# Patient Record
Sex: Male | Born: 1975 | Race: White | Hispanic: No | Marital: Married | Smoking: Never smoker
Health system: Southern US, Community
[De-identification: ages and names within clinical notes are randomized; demographics above are authoritative.]

## PROBLEM LIST (undated history)

## (undated) HISTORY — PX: FOREARM SURGERY: SHX651

## (undated) HISTORY — PX: ORIF FOREARM FRACTURE: SHX2124

---

## 2005-08-29 ENCOUNTER — Emergency Department (HOSPITAL_COMMUNITY): Admission: EM | Admit: 2005-08-29 | Discharge: 2005-08-29 | Payer: Self-pay | Admitting: Emergency Medicine

## 2005-09-03 ENCOUNTER — Ambulatory Visit (HOSPITAL_COMMUNITY): Admission: RE | Admit: 2005-09-03 | Discharge: 2005-09-03 | Payer: Self-pay | Admitting: Orthopaedic Surgery

## 2011-04-30 ENCOUNTER — Emergency Department (HOSPITAL_COMMUNITY): Payer: Self-pay

## 2011-04-30 ENCOUNTER — Emergency Department (HOSPITAL_COMMUNITY)
Admission: EM | Admit: 2011-04-30 | Discharge: 2011-04-30 | Disposition: A | Discharge status: Home / Self Care | Payer: Self-pay | Attending: Emergency Medicine | Admitting: Emergency Medicine

## 2011-04-30 DIAGNOSIS — R55 Syncope and collapse: Secondary | ICD-10-CM | POA: Insufficient documentation

## 2011-04-30 DIAGNOSIS — R42 Dizziness and giddiness: Secondary | ICD-10-CM | POA: Insufficient documentation

## 2011-04-30 DIAGNOSIS — R569 Unspecified convulsions: Secondary | ICD-10-CM | POA: Insufficient documentation

## 2011-04-30 DIAGNOSIS — R1110 Vomiting, unspecified: Secondary | ICD-10-CM | POA: Insufficient documentation

## 2011-04-30 LAB — DIFFERENTIAL
Basophils Relative: 0 % (ref 0–1)
Lymphocytes Relative: 6 % — ABNORMAL LOW (ref 12–46)
Monocytes Absolute: 0.8 10*3/uL (ref 0.1–1.0)
Monocytes Relative: 5 % (ref 3–12)

## 2011-04-30 LAB — COMPREHENSIVE METABOLIC PANEL
AST: 20 U/L (ref 0–37)
Albumin: 4.5 g/dL (ref 3.5–5.2)
Alkaline Phosphatase: 62 U/L (ref 39–117)
Chloride: 99 mEq/L (ref 96–112)
GFR calc Af Amer: >90 mL/min (ref >90)
Potassium: 3.5 mEq/L (ref 3.5–5.1)
Total Bilirubin: 0.5 mg/dL (ref 0.3–1.2)

## 2011-04-30 LAB — CBC
HCT: 43.3 % (ref 39.0–52.0)
Hemoglobin: 14.4 g/dL (ref 13.0–17.0)
MCHC: 33.3 g/dL (ref 30.0–36.0)
MCV: 87.1 fL (ref 78.0–100.0)
RBC: 4.97 MIL/uL (ref 4.22–5.81)
WBC: 16.6 10*3/uL — ABNORMAL HIGH (ref 4.0–10.5)

## 2014-06-04 ENCOUNTER — Emergency Department: Payer: Self-pay | Admitting: Emergency Medicine

## 2014-10-30 ENCOUNTER — Emergency Department (HOSPITAL_COMMUNITY): Payer: Medicaid Other

## 2014-10-30 ENCOUNTER — Encounter (HOSPITAL_COMMUNITY): Payer: Self-pay | Admitting: Emergency Medicine

## 2014-10-30 ENCOUNTER — Emergency Department (HOSPITAL_COMMUNITY)
Admission: EM | Admit: 2014-10-30 | Discharge: 2014-10-30 | Disposition: A | Discharge status: Home / Self Care | Payer: Medicaid Other | Attending: Emergency Medicine | Admitting: Emergency Medicine

## 2014-10-30 DIAGNOSIS — Y998 Other external cause status: Secondary | ICD-10-CM | POA: Insufficient documentation

## 2014-10-30 DIAGNOSIS — M549 Dorsalgia, unspecified: Secondary | ICD-10-CM

## 2014-10-30 DIAGNOSIS — S3992XA Unspecified injury of lower back, initial encounter: Secondary | ICD-10-CM | POA: Insufficient documentation

## 2014-10-30 DIAGNOSIS — Y9389 Activity, other specified: Secondary | ICD-10-CM | POA: Diagnosis not present

## 2014-10-30 DIAGNOSIS — Z79899 Other long term (current) drug therapy: Secondary | ICD-10-CM | POA: Diagnosis not present

## 2014-10-30 DIAGNOSIS — R202 Paresthesia of skin: Secondary | ICD-10-CM

## 2014-10-30 DIAGNOSIS — R110 Nausea: Secondary | ICD-10-CM | POA: Diagnosis not present

## 2014-10-30 DIAGNOSIS — M5116 Intervertebral disc disorders with radiculopathy, lumbar region: Secondary | ICD-10-CM

## 2014-10-30 DIAGNOSIS — X58XXXA Exposure to other specified factors, initial encounter: Secondary | ICD-10-CM | POA: Insufficient documentation

## 2014-10-30 DIAGNOSIS — Y929 Unspecified place or not applicable: Secondary | ICD-10-CM | POA: Insufficient documentation

## 2014-10-30 MED ORDER — OXYCODONE HCL 5 MG PO TABS
15.0000 mg | ORAL_TABLET | Freq: Once | ORAL | Status: AC
Start: 2014-10-30 — End: 2014-10-30
  Administered 2014-10-30: 15 mg via ORAL
  Filled 2014-10-30: qty 3

## 2014-10-30 MED ORDER — CYCLOBENZAPRINE HCL 10 MG PO TABS: 10.0000 mg | ORAL_TABLET | Freq: Three times a day (TID) | ORAL | Status: AC | PRN

## 2014-10-30 MED ORDER — ONDANSETRON 4 MG PO TBDP
8.0000 mg | ORAL_TABLET | Freq: Once | ORAL | Status: AC
  Administered 2014-10-30: 8 mg via ORAL
  Filled 2014-10-30: qty 2

## 2014-10-30 MED ORDER — IBUPROFEN 400 MG PO TABS
800.0000 mg | ORAL_TABLET | Freq: Once | ORAL | Status: AC
  Administered 2014-10-30: 800 mg via ORAL
  Filled 2014-10-30: qty 2

## 2014-10-30 MED ORDER — ONDANSETRON 4 MG PO TBDP: ORAL_TABLET | ORAL | Status: AC

## 2014-10-30 MED ORDER — PREDNISONE 20 MG PO TABS
60.0000 mg | ORAL_TABLET | Freq: Once | ORAL | Status: AC
  Administered 2014-10-30: 60 mg via ORAL
  Filled 2014-10-30: qty 3

## 2014-10-30 MED ORDER — OXYCODONE-ACETAMINOPHEN 10-325 MG PO TABS: 1.0000 | ORAL_TABLET | ORAL | Status: DC | PRN

## 2014-10-30 MED ORDER — HYDROMORPHONE HCL 1 MG/ML IJ SOLN
2.0000 mg | Freq: Once | INTRAMUSCULAR | Status: AC
  Administered 2014-10-30: 2 mg via INTRAMUSCULAR
  Filled 2014-10-30: qty 2

## 2014-10-30 MED ORDER — DIAZEPAM 5 MG/ML IJ SOLN
5.0000 mg | Freq: Once | INTRAMUSCULAR | Status: AC
  Administered 2014-10-30: 5 mg via INTRAMUSCULAR
  Filled 2014-10-30: qty 2

## 2014-10-30 MED ORDER — PREDNISONE 20 MG PO TABS: ORAL_TABLET | ORAL | Status: AC

## 2014-10-30 MED ORDER — IBUPROFEN 800 MG PO TABS: 800.0000 mg | ORAL_TABLET | Freq: Three times a day (TID) | ORAL | Status: AC

## 2014-10-30 NOTE — ED Notes (Signed)
Pt sts yesterday that he bent over to pick something up and felt a pop in lower back. C.o pain radiating down both legs. Denies weakness/numbness/tingling. Pt ambulatory.

## 2014-10-30 NOTE — Discharge Instructions (Signed)
1. Medications: Oxycodone, Flexeril, ibuprofen, prednisone, Zofran, usual home medications 2. Treatment: rest, drink plenty of fluids, gentle stretching as discussed, alternate ice and heat 3. Follow Up: Please followup with your primary doctor in 3 days for discussion of your diagnoses and further evaluation after today's visit; if you do not have a primary care doctor use the resource guide provided to find one;  Return to the ER for worsening back pain, difficulty walking, loss of bowel or bladder control or other concerning symptoms    Herniated Disk A herniated disk occurs when a disk in your spine bulges out too far. This condition is also called a ruptured disk or slipped disk. Your spine (backbone) is made up of bones called vertebrae. Between each pair of vertebrae is an oval disk with a soft, spongy center that acts as a shock absorber when you move. The spongy center is surrounded by a tough outer ring. When you have a herniated disk, the spongy center of the disk bulges out or ruptures through the outer ring. A herniated disk can press on a nerve between your vertebrae and cause pain. A herniated disk can occur anywhere in your back or neck area, but the lower back is the most common spot. CAUSES  In many cases, a herniated disk occurs just from getting older. As you age, the spongy insides of your disks tend to shrink and dry out. A herniated disk can result from gradual wear and tear. Injury or sudden strain can also cause a herniated disk.  RISK FACTORS Aging is the main risk factor for a herniated disk. Other risk factors include:  Being a man between the ages of 83 and 37 years.  Having a job that requires heavy lifting, bending, or twisting.  Having a job that requires long hours of driving.  Not getting enough exercise.  Being overweight.  Smoking. SIGNS AND SYMPTOMS  Signs and symptoms depend on which disk is herniated.  For a herniated disk in the lower back, you may  have sharp pain in:  One part of your leg, hip, or buttocks.  The back of your calf.  The top or sole of your foot (sciatica).   For a herniated disk in the neck, you may feel pain:  When you move your neck.  Near or over your shoulder blade.  That moves to your upper arm, forearm, or fingers.   You may also have muscle weakness. It may be hard to:  Lift your leg or arm.  Stand on your toes.  Squeeze tightly with one of your hands.  Other symptoms can include:  Numbness or tingling in the affected areas of your body.  Loss of bladder or bowel control. This is a rare but serious sign of a severe herniated disk in the lower back. DIAGNOSIS  Your health care provider will do a physical exam. During this exam, you may have to move certain body parts or assume various positions. For example, your health care provider may do the straight-leg test. This is a good way to test for a herniated disk in your lower back. In this test, the health care provider lifts your leg while you lie on your back. This is to see if you feel pain down your leg. Your health care provider will also check for numbness or loss of feeling.  Your health care provider will also check your:  Reflexes.  Muscle strength.  Posture.  Other tests may be done to help in making  a diagnosis. These may include:  An X-ray of the spine to rule out other causes of back pain.   Other imaging studies, such as an MRI or CT scan. This is to check whether the herniated disk is pressing on your spinal canal.  Electromyography (EMG). This test checks the nerves that control muscles. It is sometimes used to identify the specific area of nerve involvement.  TREATMENT  In many cases, herniated disk symptoms go away over a period of days or weeks. You will most likely be free of symptoms in 3-4 months. Treatment may include the following:  The initial treatment for a herniated disk is ashort period of rest.  Bed rest  is often limited to 1 or 2 days. Resting for too long delays recovery.  If you have a herniated disk in your lower back, you should avoid sitting as much as possible because sitting increases pressure on the disk.  Medicines. These may include:   Nonsteroidal anti-inflammatory drugs (NSAIDs).  Muscle relaxants for back spasms.  Narcotic pain medicine if your pain is very bad.   Steroid injections. You may need these along the involved nerve root to help control pain. The steroid is injected in the area of the herniated disk. It helps by reducing swelling around the disk.  Physical therapy. This may include exercises to strengthen the muscles that help support your spine.   You may need surgery if other treatments do not work.  HOME CARE INSTRUCTIONS Follow all your health care provider's instructions. These may include:  Take all medicines as directed by your health care provider.  Rest for 2 days and then start moving.  Do not sit or stand for long periods of time.  Maintain good posture when sitting and standing.  Avoid movements that cause pain, such as bending or lifting.  When you are able to start lifting things again:  [place] with your knees.  Keep your back straight.  Hold heavy objects close to your body.  If you are overweight, ask your health care provider to help you start a weight-loss program.  When you are able to start exercising, ask your health care provider how much and what type of exercise is best for you.  Work with a physical therapist on stretching and strengthening exercises for your back.  Do not wear high-heeled shoes.  Do not sleep on your belly.  Do not smoke.  Keep all follow-up visits as directed by your health care provider. SEEK MEDICAL CARE IF:  You have back or neck pain that is not getting better after 4 weeks.  You have very bad pain in your back or neck.  You develop numbness, tingling, or weakness along with pain. SEEK  IMMEDIATE MEDICAL CARE IF:   You have numbness, tingling, or weakness that makes you unable to use your arms or legs.  You lose control of your bladder or bowels.  You have dizziness or fainting.  You have shortness of breath.  MAKE SURE YOU:   Understand these instructions.  Will watch your condition.  Will get help right away if you are not doing well or get worse. Document Released: 06/25/2000 Document Revised: 11/12/2013 Document Reviewed: 06/01/2013 [facility] Patient Information 2015 [place], [place]. This information is not intended to replace advice given to you by your health care provider. Make sure you discuss any questions you have with your health care provider.    Emergency Department Resource Guide 1) Find a Doctor and Pay Out of Pocket  Although you won't have to find out who is covered by your insurance plan, it is a good idea to ask around and get recommendations. You will then need to call the office and see if the doctor you have chosen will accept you as a new patient and what types of options they offer for patients who are self-pay. Some doctors offer discounts or will set up payment plans for their patients who do not have insurance, but you will need to ask so you aren't surprised when you get to your appointment.  2) Contact Your Local Health Department Not all health departments have doctors that can see patients for sick visits, but many do, so it is worth a call to see if yours does. If you don't know where your local health department is, you can check in your phone book. The CDC also has a tool to help you locate your state's health department, and many state websites also have listings of all of their local health departments.  3) Find a Walk-in Clinic If your illness is not likely to be very severe or complicated, you may want to try a walk in clinic. These are popping up all over the country in pharmacies, drugstores, and shopping centers. They're usually  staffed by nurse practitioners or physician assistants that have been trained to treat common illnesses and complaints. They're usually fairly quick and inexpensive. However, if you have serious medical issues or chronic medical problems, these are probably not your best option.  No Primary Care Doctor: - Call Health Connect at  [phone] - they can help you locate a primary care doctor that  accepts your insurance, provides certain services, etc. - Physician Referral Service- [phone]  Chronic Pain Problems: Organization         Address  Phone   Notes  [facility]  [phone] Patients need to be referred by their primary care doctor.   Medication Assistance: Organization         Address  Phone   Notes  [facility] [address] [place] [place], [place] [address] [phone] --Must be a resident of [facility] -- Must have NO insurance coverage whatsoever (no Medicaid/ Medicare, etc.) -- The pt. MUST have a primary care doctor that directs their care regularly and follows them in the community   [facility]  [phone]   [employer]  [phone]    Agencies that provide inexpensive medical care: Organization         Address  Phone   Notes  [facility]  [phone]   [facility]    [phone]   [facility] [address] [place], [place] [address] [phone]   [facility] [address] [place] [place] [phone]   Planned Parenthood    [phone]   [facility]    [phone]   [facility]  [address], [place] Phone:  [phone], Fax:  [phone] Hours of Operation:  9 am - 6 pm, M-F.  Also accepts Medicaid/Medicare and self-pay.  [facility]  [address], [place] Phone: [phone], Fax: [phone]. Hours of  Operation:  8:30 am - 5:30 pm, M-F.  Also accepts Medicaid and self-pay.  [facility] [place] [address], [employer] Phone: [phone]   [facility]  [address] [place], [place] [phone], Ext. [phone] Mondays & Thursdays: 7-9 AM.  First 15 patients are seen on a first come, first serve basis.    Medicaid-accepting [facility] Providers:  Organization         Address  Phone   Notes  [facility] [address], [place] [phone] Also accepts self-pay patients.  [facility] [address] Laurell Josephs [place], [place]  [phone]   [facility] [address], [place] [phone]   [facility] [address] [place] [phone]   Renaye Rakers [address], [place]   [phone] Only accepts [place] patients after they have their name applied to their card.   Self-Pay (no insurance) in [facility]:  Organization         Address  Phone   Notes  Sickle Cell Patients, [facility] [address] [place], [place] [phone]   [facility] [address] [place], [place] [phone]   [facility] [place]  [address], [place] [phone]   [facility]/Dr. Osei-Bonsu  [address], [place] or [address], [place] [phone] Phone number for both [place] and [place] locations is the same.  Urgent Medical and [facility] [address], [place] [phone]   [facility] [address] [place] or [address] [phone]   [facility] [address] [place] [phone], phone; [phone], fax Sees patients 1st and 3rd Saturday of every month.  Must not qualify for public or private insurance (i.e. Medicaid, Medicare,  [place] Health Choice, Veterans' Benefits)  Household income should be no more than 200% of the poverty level The clinic cannot treat you if you are pregnant or think you are pregnant  Sexually transmitted diseases are not treated at the clinic.    Dental Care: Organization         Address  Phone  Notes  [facility] Department of [facility] [address] [place], [place] [phone] Accepts children up to age 27 who are enrolled in [place] or [place] Health Choice; pregnant women with a Medicaid card; and children who have applied for Medicaid or [place] Health Choice, but were declined, whose parents can pay a reduced fee at time of service.  [facility] Department of [facility]  [address], [place] [phone] Accepts children up to age 28 who are enrolled in [place] or [place] Health Choice; pregnant women with a Medicaid card; and children who have applied for Medicaid or [place] Health Choice, but were declined, whose parents can pay a reduced fee at time of service.  [facility]  [address] [place], [place] [phone] Patients are seen by appointment only. Walk-ins are not accepted. [facility] will see patients 27 years of age and older. Monday - Tuesday (8am-5pm) Most Wednesdays (8:30-5pm) $30 per visit, cash only  [facility] Adult Dental Access [facility]  [address], [facility] [phone] Patients are seen by appointment only. Walk-ins are not accepted. [facility] will see patients 29 years of age and older. One Wednesday Evening (Monthly: Volunteer Based).  $30 per visit, cash only  [employer]  [employer] [facility] [facility]  [phone] for adults; Children under age 2, call Graduate Pediatric Dentistry at [phone]. Children aged 71-14, please call [phone] to request a pediatric application.  Dental services are provided in all areas of dental care including fillings, crowns and bridges,  complete and partial dentures, implants, gum treatment, root canals, and extractions. Preventive care is also provided. Treatment is provided to both adults and children. Patients are selected via a lottery and there is often a waiting list.   [facility] [address], [employer]  [phone] [email or web address]   [facility] [address] [place], [place] [phone], Ext. [phone] Second and Fourth Thursday of each month, opens at 6:30 AM; Clinic ends at 9 AM.  Patients are seen on a first-come first-served basis, and a limited number are seen during each clinic.   [facility]  [address] Ether [place]ins [place], [place] [phone]   Eligibility Requirements You must have lived in [place], [place], or [place] counties for at least the last three months.   You cannot be eligible for state or federal sponsored [place], including [employer], [place], or [employer].   You generally cannot be eligible for healthcare insurance through your employer.    How to apply: Eligibility screenings are held every Tuesday and Wednesday afternoon from 1:00 pm until 4:00 pm. You do not need an appointment for the interview!  [facility] [address], [place], [place] [phone]   [facility] Health Department  [phone]   [facility] Health Department  [phone]   [facility] [place] [facility]  [phone]    Behavioral Health Resources in the Community: Intensive Outpatient Programs Organization         Address  Phone  Notes  [facility] [address], [place], [place] [phone]   [facility] [address] [place], [place] [phone]   [facility] [address] [place], [place]  [phone]   [facility] [address],  [place], [place] [phone] or [phone]   Substance Abuse Resources Organization          Address  Phone  Notes  [facility] Drug Services  [phone]   [facility]  [phone]   [facility] [place]  [phone]   Floydene Flock  [phone]   Residential & Outpatient Substance Abuse Program  [phone]   Psychological Services Organization         Address  Phone  Notes  [facility]  [phone]   [facility]  [phone]   [facility] [address] [place] [phone] or [phone]    Mobile Crisis Teams Organization         Address  Phone  Notes  [facility], Mobile Crisis Care Unit  [phone]   [facility]  [address] [place], [place] [phone]   Doristine Locks [address] [place] [phone]    Self-Help/Support Groups Organization         Address  Phone             Notes  [facility] [place] - variety of support groups  [phone]- I7437963 Call for more information  Narcotics Anonymous (NA), [facility] [address], [employer] [place]  2 meetings at this location   Statistician  Address  Phone  Notes  [facility] [address],    [place]  [phone]   [facility]  [address], [place] [phone], [place], [place] [phone]   [facility] [address], [place] [phone] Admissions: 8am-3pm M-F  [facility] [address]    [place], [place] [phone]   [facility] [address], [place], [place] [phone]   [facility] [address]  [place], [place] [phone]   [facility] - Intensive Outpatient [address], [place], [place] [phone]   [facility] ([facility] [address]  [place], [place] [phone] or [phone]   [facility] ([facility]) [address] [place], [place]  [phone] Accepts Medicaid  [facility] [address]  [place] [phone] Substance Abuse/Addiction Treatment   [facility] Behavioral Health Resources Organization         Address  Phone  Notes  [facility]  [phone]) [phone]   Angie FavaJulie Brannon, PhD [address] [place], [place]   [phone]) [phone] or [phone]) [phone]   [facility]   [address] [place], [place] [phone](336) [phone]   [facility] [address], [place], [place] [phone](336) [phone] Insurance/Medicaid/sponsorship through [facility]  [facility] [address]                                    [place], [place] [phone](336) [phone] Therapy/tele-psych/case  [facility] [address].   [place], [place] [phone](336) [phone]    Dr. Lolly MustacheArfeen  [phone]   [facility]  [employer] [facility] 1) [address], [place] 2) [address], [place] 3)  [address], [place] [phone](336) [phone]  [phone]) [phone]   [facility] Child Abuse Hotline [phone](336) [phone] or [phone](336) [phone] (After Hours)

## 2014-10-30 NOTE — ED Provider Notes (Signed)
CSN: [id number]     Arrival date & time 10/30/14  1954 History  This chart is scribed for non-physician practitioner, Dierdre ForthHannah Sarea Fyfe, PA-C, working with Eber HongBrian Miller, MD by Abel PrestoKara Demonbreun, ED Scribe.  This patient was seen in room TR08C/TR08C and the patient's care was started 8:45 PM.      Chief Complaint  Patient presents with  . Back Pain    Patient is a 39 y.o. male presenting with back pain. The history is provided by the patient, medical records and a significant other. No language interpreter was used.  Back Pain Associated symptoms: leg pain, paresthesias (shooting), tingling and weakness   Associated symptoms: no abdominal pain, no bladder incontinence, no bowel incontinence, no chest pain, no dysuria, no fever, no headaches, no numbness and no perianal numbness    HPI Comments: Peter Sierrasravis L Glover is a 39 y.o. male who presents to the Emergency Department complaining of constant lower back pain with onset last night. Pt states he bent over to pick up something and heard a pop at onset. Pt states pain is worse in sacrococcyx region. He reports pain radiates down bilateral legs to knees with sharp pain. Pt notes associated subjective weakness, tingling, and nausea. Pt last took ibuprofen for relief around 12 PM. He is able to ambulate. Pt denies significant PMHx or h/o back problems. Pt with h/o of surgery in left forearm and metal rod placement. Pt denies numbness, bowel or bladder incontinence, vomiting, and saddle anesthesia.   History reviewed. No pertinent past medical history. Past Surgical History  Procedure Laterality Date  . Forearm surgery Left    No family history on file. History  Substance Use Topics  . Smoking status: Never Smoker   . Smokeless tobacco: Current User  . Alcohol Use: No    Review of Systems  Constitutional: Negative for fever and fatigue.  Respiratory: Negative for chest tightness and shortness of breath.   Cardiovascular: Negative for chest  pain.  Gastrointestinal: Positive for nausea. Negative for vomiting, abdominal pain, diarrhea and bowel incontinence.  Genitourinary: Negative for bladder incontinence, dysuria, urgency, frequency and hematuria.  Musculoskeletal: Positive for myalgias, back pain and gait problem ( 2/2 pain). Negative for joint swelling, neck pain and neck stiffness.  Skin: Negative for rash.  Neurological: Positive for tingling, weakness and paresthesias (shooting). Negative for light-headedness, numbness and headaches.  All other systems reviewed and are negative.     Allergies  Review of patient's allergies indicates no known allergies.  Home Medications   Prior to Admission medications   Medication Sig Start Date End Date Taking? Authorizing Provider  cetirizine-pseudoephedrine (ZYRTEC-D) 5-120 MG per tablet Take 1 tablet by mouth daily.   Yes Historical Provider, MD  cyclobenzaprine (FLEXERIL) 10 MG tablet Take 1 tablet (10 mg total) by mouth 3 (three) times daily as needed for muscle spasms. 10/30/14   Miah Boye, PA-C  ibuprofen (ADVIL,MOTRIN) 800 MG tablet Take 1 tablet (800 mg total) by mouth 3 (three) times daily. 10/30/14   Choua Ikner, PA-C  ondansetron (ZOFRAN ODT) 4 MG disintegrating tablet 4mg  ODT q4 hours prn nausea/vomit 10/30/14   Yarlin Breisch, PA-C  oxyCODONE-acetaminophen (PERCOCET) 10-325 MG per tablet Take 1 tablet by mouth every 4 (four) hours as needed for pain. 10/30/14   Clarissa Laird, PA-C  predniSONE (DELTASONE) 20 MG tablet 3 tabs po daily x 3 days, then 2 tabs x 3 days, then 1.5 tabs x 3 days, then 1 tab x 3 days, then 0.5 tabs x 3  days 10/30/14   Dahlia Client Magenta Schmiesing, PA-C   BP 115/75 mmHg  Pulse 74  Temp(Src) 97.4 F (36.3 C) (Oral)  Resp 16  Ht  (1.854 m)  Wt 220 lb (99.791 kg)  BMI 29.03 kg/m2  SpO2 96% Physical Exam  Constitutional: He appears well-developed and well-nourished. No distress.  HENT:  Head: Normocephalic and atraumatic.   Mouth/Throat: Oropharynx is clear and moist. No oropharyngeal exudate.  Eyes: Conjunctivae are normal.  Neck: Normal range of motion. Neck supple.  Full ROM without pain  Cardiovascular: Normal rate, regular rhythm, normal heart sounds and intact distal pulses.   Pulmonary/Chest: Effort normal and breath sounds normal. No respiratory distress. He has no wheezes.  Abdominal: Soft. He exhibits no distension. There is no tenderness.  Musculoskeletal:  Full range of motion of the T-spine and L-spine No tenderness to palpation of the spinous processes of the T-spine  Significant tenderness to palpation of the spinous processes of  L5-S1 Tenderness to palpation of the paraspinous muscles of the L-spine  Lymphadenopathy:    He has no cervical adenopathy.  Neurological: He is alert. He has normal reflexes.  Reflex Scores:      Bicep reflexes are 2+ on the right side and 2+ on the left side.      Brachioradialis reflexes are 2+ on the right side and 2+ on the left side.      Patellar reflexes are 2+ on the right side and 2+ on the left side.      Achilles reflexes are 2+ on the right side and 2+ on the left side. Speech is clear and goal oriented, follows commands Normal 5/5 strength in upper and lower extremities bilaterally including dorsiflexion and plantar flexion, strong and equal grip strength Sensation normal to light and sharp touch Moves extremities without ataxia, coordination intact Antalgic gait Normal balance No Clonus   Skin: Skin is warm and dry. No rash noted. He is not diaphoretic. No erythema.  Psychiatric: He has a normal mood and affect. His behavior is normal.  Nursing note and vitals reviewed.   ED Course  Procedures (including critical care time) DIAGNOSTIC STUDIES: Oxygen Saturation is 98% on room air, normal by my interpretation.    COORDINATION OF CARE: 8:51 PM Discussed treatment plan with patient at beside, the patient agrees with the plan and has no  further questions at this time.   Labs Review Labs Reviewed - No data to display  Imaging Review Mr Lumbar Spine Wo Contrast  10/30/2014   CLINICAL DATA:  Constant low back pain beginning last night after lifting injury. Pain radiates to bilateral lower extremities.  EXAM: MRI LUMBAR SPINE WITHOUT CONTRAST  TECHNIQUE: Multiplanar, multisequence MR imaging of the lumbar spine was performed. No intravenous contrast was administered.  COMPARISON:  None.  FINDINGS: Lumbar vertebral bodies and posterior elements are intact and aligned with maintenance of lumbar lordosis. Using the reference level of the last well-formed intervertebral disc as L5-S1, moderate L5-S1 disc height loss with decreased T2 signal within this disc consistent with mild desiccation. Very mild chronic discogenic endplate change L5-S1. No STIR signal abnormality to suggest acute osseous process.  Conus medullaris terminates at L1 and appears normal morphology and signal characteristics. Cauda equina is normal in appearance. Included prevertebral and paraspinal soft tissues are normal.  Level by level evaluation:  T12-L1, L1-2, L2-3, L3-4: No disc bulge, canal stenosis nor neural foraminal narrowing.  L4-5: Small broad-based disc bulge. Mild facet arthropathy, no canal stenosis. Mild RIGHT,  minimal LEFT neural foraminal narrowing.  L5-S1: 4 mm RIGHT central disc protrusion with annular fissure. Protrusion encroaches upon the traversing RIGHT greater than LEFT S1 nerves in the lateral recesses. No canal stenosis. Mild facet arthropathy. Minimal neural foraminal narrowing.  IMPRESSION: Small to moderate RIGHT central L5-S1 disc protrusion with annular fissure encroaches upon the traversing S1 nerves.  No canal stenosis. Minimal to mild L4-5 and L5-S1 neural foraminal narrowing.  No acute fracture nor malalignment.   Electronically Signed   By: Awilda Metro   On: 10/30/2014 22:36     EKG Interpretation None      MDM   Final  diagnoses:  Back pain  Paresthesias  Lumbar disc herniation with radiculopathy   Peter Sierras presents with sudden onset back pain with immediate radiation and paresthesias concerning for disc herniation. Will obtain MRI and give pain control.  Patient with subjective weakness however 5/5 strength of the bilateral lower extremities on exam. Patient with sensation intact and ambulates with pain but without foot drop or dragging of leg.   11:26 PM MRI with L5-S1 disc protrusion encroaching on the S1 nerves bilaterally; likely the cause of patient's paresthesias. Discussed treatment options and outpatient follow-up with neurosurgery.  Patient is neurologically intact.  Patient's pain is somewhat controlled at this time. Will give further narcotic pain control and discharge home with pain medicine, muscle relaxers and prednisone.   BP 115/75 mmHg  Pulse 74  Temp(Src) 97.4 F (36.3 C) (Oral)  Resp 16  Ht  (1.854 m)  Wt 220 lb (99.791 kg)  BMI 29.03 kg/m2  SpO2 96%  I personally performed the services described in this documentation, which was scribed in my presence. The recorded information has been reviewed and is accurate.   Dahlia Client Jadyn Brasher, PA-C 10/30/14 2329  Eber Hong, MD 10/31/14 [phone]

## 2014-11-30 ENCOUNTER — Emergency Department (HOSPITAL_COMMUNITY): Payer: Medicaid Other

## 2014-11-30 ENCOUNTER — Emergency Department (HOSPITAL_COMMUNITY)
Admission: EM | Admit: 2014-11-30 | Discharge: 2014-11-30 | Disposition: A | Discharge status: Home / Self Care | Payer: Medicaid Other | Attending: Emergency Medicine | Admitting: Emergency Medicine

## 2014-11-30 ENCOUNTER — Encounter (HOSPITAL_COMMUNITY): Payer: Self-pay | Admitting: *Deleted

## 2014-11-30 DIAGNOSIS — Y9389 Activity, other specified: Secondary | ICD-10-CM | POA: Insufficient documentation

## 2014-11-30 DIAGNOSIS — Y998 Other external cause status: Secondary | ICD-10-CM | POA: Insufficient documentation

## 2014-11-30 DIAGNOSIS — S5012XA Contusion of left forearm, initial encounter: Secondary | ICD-10-CM | POA: Diagnosis not present

## 2014-11-30 DIAGNOSIS — W010XXA Fall on same level from slipping, tripping and stumbling without subsequent striking against object, initial encounter: Secondary | ICD-10-CM | POA: Insufficient documentation

## 2014-11-30 DIAGNOSIS — Y9289 Other specified places as the place of occurrence of the external cause: Secondary | ICD-10-CM | POA: Insufficient documentation

## 2014-11-30 DIAGNOSIS — S6992XA Unspecified injury of left wrist, hand and finger(s), initial encounter: Secondary | ICD-10-CM | POA: Insufficient documentation

## 2014-11-30 DIAGNOSIS — S59912A Unspecified injury of left forearm, initial encounter: Secondary | ICD-10-CM | POA: Diagnosis present

## 2014-11-30 MED ORDER — HYDROCODONE-ACETAMINOPHEN 5-325 MG PO TABS
1.0000 | ORAL_TABLET | Freq: Once | ORAL | Status: AC
  Administered 2014-11-30: 1 via ORAL
  Filled 2014-11-30: qty 1

## 2014-11-30 MED ORDER — NAPROXEN 500 MG PO TABS: 500.0000 mg | ORAL_TABLET | Freq: Two times a day (BID) | ORAL | Status: AC

## 2014-11-30 MED ORDER — HYDROCODONE-ACETAMINOPHEN 5-325 MG PO TABS: ORAL_TABLET | ORAL | Status: DC

## 2014-11-30 NOTE — ED Provider Notes (Signed)
CSN: [id number]     Arrival date & time 11/30/14  1911 History  This chart was scribed for non-physician practitioner Rhea BleacherJosh Taahir Grisby, PA, working with Pricilla LovelessScott Goldston, MD, by Tanda RockersMargaux Venter, ED Scribe. This patient was seen in room TR07C/TR07C and the patient's care was started at 7:55 PM.    Chief Complaint  Patient presents with  . Arm Injury   The history is provided by the patient. No language interpreter was used.     HPI Comments: Peter Glover is a 39 y.o. male who presents to the Emergency Department complaining of left forearm pain x 3-4 days, worsening today. Pt states that he was carrying a sheep and tripped over a block, falling face first and caught himself with his hands. He notes increased swelling to the forearm. The pain in his arm is exacerbated with deep breathing. Pt notes that his 3rd-5th digits on his left hand have been numb. He has been taking Ibuprofen and applying IcyHot without relief. Pt mentions previous injury to the same forearm, needing surgery with rod placement. Denies tingling, weakness, redness to the arm, or any other associated symptoms.    History reviewed. No pertinent past medical history. History reviewed. No pertinent past surgical history. History reviewed. No pertinent family history. History  Substance Use Topics  . Smoking status: Never Smoker   . Smokeless tobacco: Not on file  . Alcohol Use: Not on file    Review of Systems  Musculoskeletal: Positive for arthralgias (Left forearm pain. ).  Skin: Negative for wound.  Neurological: Positive for numbness (3rd, 4th, and 5th digits of left hand. ). Negative for weakness.      Allergies  Review of patient's allergies indicates no known allergies.  Home Medications   Prior to Admission medications   Not on File   Triage Vitals: BP 142/89 mmHg  Pulse 67  Temp(Src) 98 F (36.7 C) (Oral)  Resp 20  Wt 220 lb (99.791 kg)  SpO2 100%   Physical Exam  Constitutional: He is oriented to  person, place, and time. He appears well-developed and well-nourished. No distress.  HENT:  Head: Normocephalic and atraumatic.  Eyes: Conjunctivae and EOM are normal.  Neck: Normal range of motion. Neck supple. No tracheal deviation present.  Cardiovascular: Normal rate and normal pulses.   Pulmonary/Chest: Effort normal. No respiratory distress.  Musculoskeletal: Normal range of motion. He exhibits tenderness. He exhibits no edema.       Left shoulder: Normal.       Left elbow: Normal.       Left wrist: He exhibits tenderness and bony tenderness. He exhibits normal range of motion, no swelling and no effusion.       Cervical back: Normal.       Left upper arm: Normal.       Left forearm: He exhibits tenderness and bony tenderness. He exhibits no swelling and no edema.       Arms:      Left hand: Normal. Normal sensation noted. Normal strength noted.  Neurological: He is alert and oriented to person, place, and time. No sensory deficit.  Motor, sensation, and vascular distal to the injury is fully intact.   Skin: Skin is warm and dry.  Psychiatric: He has a normal mood and affect. His behavior is normal.  Nursing note and vitals reviewed.   ED Course  Procedures (including critical care time)  DIAGNOSTIC STUDIES: Oxygen Saturation is 100% on RA, normal by my interpretation.    COORDINATION  OF CARE: 7:57 PM-Discussed treatment plan which includes DG L forearm with pt at bedside and pt agreed to plan. Pt is asking for pain medication at this time due to the severity of the pain.   Labs Review Labs Reviewed - No data to display  Imaging Review Dg Forearm Left  11/30/2014   CLINICAL DATA:  Acute onset of left forearm pain and edema, status post fall. Initial encounter.  EXAM: LEFT FOREARM - 2 VIEW  COMPARISON:  Left forearm radiographs performed 06/04/2014  FINDINGS: There is no evidence of fracture or dislocation. Hardware along the midshaft of the radius and distal ulna appears  intact, without evidence of loosening.  An osseous fragment at the ulnar styloid reflects remote injury. The carpal rows appear grossly intact, and demonstrate normal alignment. The elbow joint is grossly unremarkable in appearance. No elbow joint effusion is seen.  IMPRESSION: No evidence of fracture or dislocation. Visualized hardware appears intact, without evidence of loosening.   Electronically Signed   By: Roanna Raider M.D.   On: 11/30/2014 21:01   Dg Wrist Complete Left  11/30/2014   CLINICAL DATA:  Left wrist pain, status post fall 2 days ago, history of surgery in 2008  EXAM: LEFT WRIST - COMPLETE 3+ VIEW  COMPARISON:  None.  FINDINGS: No evidence of acute fracture or dislocation.  Compression plate and screw fixation of the distal ulnar shaft, incompletely visualized.  Old ulnar styloid fracture.  The visualized soft tissues are unremarkable.  IMPRESSION: No evidence of acute fracture or dislocation.  Status post ORIF of the distal ulnar shaft, incompletely visualized.   Electronically Signed   By: Charline Bills M.D.   On: 11/30/2014 20:55     EKG Interpretation None       9:28 PM Pt updated on x-ray results. Will d/c to home with pain medication, ortho f/u in case he has continued pain despite conservative treatment. Offered sling but patient states that he has one at home.  Patient counseled on use of narcotic pain medications. Counseled not to combine these medications with others containing tylenol. Urged not to drink alcohol, drive, or perform any other activities that requires focus while taking these medications. The patient verbalizes understanding and agrees with the plan.   MDM   Final diagnoses:  Forearm contusion, left, initial encounter   Patient with forearm injury several days ago after fall. X-rays are negative. Upper extremity is neurovascularly intact. Do not suspect occult navicular fracture. No signs of compartment syndrome.   I personally performed the  services described in this documentation, which was scribed in my presence. The recorded information has been reviewed and is accurate.      Renne Crigler, PA-C 11/30/14 2131  Pricilla Loveless, MD 12/05/14 1022

## 2014-11-30 NOTE — Discharge Instructions (Signed)
Please read and follow all provided instructions.  Your diagnoses today include:  1. Forearm contusion, left, initial encounter    Tests performed today include:  An x-ray of the affected area - does NOT show any broken bones, shows hardware appears to be intact  Vital signs. See below for your results today.   Medications prescribed:   Vicodin (hydrocodone/acetaminophen) - narcotic pain medication  DO NOT drive or perform any activities that require you to be awake and alert because this medicine can make you drowsy. BE VERY CAREFUL not to take multiple medicines containing Tylenol (also called acetaminophen). Doing so can lead to an overdose which can damage your liver and cause liver failure and possibly death.   Naproxen - anti-inflammatory pain medication  Do not exceed 500mg  naproxen every 12 hours, take with food  You have been prescribed an anti-inflammatory medication or NSAID. Take with food. Take smallest effective dose for the shortest duration needed for your pain. Stop taking if you experience stomach pain or vomiting.   Take any prescribed medications only as directed.  Home care instructions:   Follow any educational materials contained in this packet  Follow R.I.C.E. Protocol:  R - rest your injury   I  - use ice on injury without applying directly to skin  C - compress injury with bandage or splint  E - elevate the injury as much as possible  Follow-up instructions: Please follow-up with your primary care provider or the provided orthopedic physician (bone specialist) if you continue to have significant pain in 1 week. In this case you may have a more severe injury that requires further care.   Return instructions:   Please return if your fingers are numb or tingling, appear gray or blue, or you have severe pain (also elevate the arm and loosen splint or wrap if you were given one)  Please return to the Emergency Department if you experience worsening  symptoms.   Please return if you have any other emergent concerns.  Additional Information:  Your vital signs today were: BP 142/89 mmHg   Pulse 67   Temp(Src) 98 F (36.7 C) (Oral)   Resp 20   Wt 220 lb (99.791 kg)   SpO2 100% If your blood pressure (BP) was elevated above 135/85 this visit, please have this repeated by your doctor within one month. --------------

## 2014-11-30 NOTE — ED Notes (Signed)
Pt in after a fall a few days ago c/o pain to his left forearm, swelling noted, pt states he has had surgery in the past to this arm

## 2014-11-30 NOTE — ED Notes (Signed)
Patient transported to X-ray 

## 2014-12-10 ENCOUNTER — Encounter (HOSPITAL_COMMUNITY): Payer: Self-pay | Admitting: Emergency Medicine

## 2014-12-10 ENCOUNTER — Emergency Department (HOSPITAL_COMMUNITY)
Admission: EM | Admit: 2014-12-10 | Discharge: 2014-12-10 | Disposition: A | Discharge status: Home / Self Care | Payer: Medicaid Other | Attending: Emergency Medicine | Admitting: Emergency Medicine

## 2014-12-10 DIAGNOSIS — M79632 Pain in left forearm: Secondary | ICD-10-CM | POA: Diagnosis not present

## 2014-12-10 DIAGNOSIS — Z8781 Personal history of (healed) traumatic fracture: Secondary | ICD-10-CM | POA: Diagnosis not present

## 2014-12-10 MED ORDER — OXYCODONE-ACETAMINOPHEN 5-325 MG PO TABS
1.0000 | ORAL_TABLET | Freq: Once | ORAL | Status: AC
  Administered 2014-12-10: 1 via ORAL
  Filled 2014-12-10: qty 1

## 2014-12-10 MED ORDER — NAPROXEN 500 MG PO TABS: 500.0000 mg | ORAL_TABLET | Freq: Two times a day (BID) | ORAL | Status: AC | PRN

## 2014-12-10 MED ORDER — OXYCODONE-ACETAMINOPHEN 5-325 MG PO TABS: 1.0000 | ORAL_TABLET | Freq: Four times a day (QID) | ORAL | Status: DC | PRN

## 2014-12-10 NOTE — Discharge Instructions (Signed)
Use naprosyn and percocet as directed, as needed for pain, but don't drive while taking percocet. Use heat to the area of pain, 20 minutes at a time every hour. Follow up with the hand specialist given to you at your last ER stay. Return to the ER for changes or worsening symptoms.   Musculoskeletal Pain Musculoskeletal pain is muscle and boney aches and pains. These pains can occur in any part of the body. Your caregiver may treat you without knowing the cause of the pain. They may treat you if blood or urine tests, X-rays, and other tests were normal.  CAUSES There is often not a definite cause or reason for these pains. These pains may be caused by a type of germ (virus). The discomfort may also come from overuse. Overuse includes working out too hard when your body is not fit. Boney aches also come from weather changes. Bone is sensitive to atmospheric pressure changes. HOME CARE INSTRUCTIONS   Ask when your test results will be ready. Make sure you get your test results.  Only take over-the-counter or prescription medicines for pain, discomfort, or fever as directed by your caregiver. If you were given medications for your condition, do not drive, operate machinery or power tools, or sign legal documents for 24 hours. Do not drink alcohol. Do not take sleeping pills or other medications that may interfere with treatment.  Continue all activities unless the activities cause more pain. When the pain lessens, slowly resume normal activities. Gradually increase the intensity and duration of the activities or exercise.  During periods of severe pain, bed rest may be helpful. Lay or sit in any position that is comfortable.  Putting ice on the injured area.  Put ice in a bag.  Place a towel between your skin and the bag.  Leave the ice on for 15 to 20 minutes, 3 to 4 times a day.  Follow up with your caregiver for continued problems and no reason can be found for the pain. If the pain becomes  worse or does not go away, it may be necessary to repeat tests or do additional testing. Your caregiver may need to look further for a possible cause. SEEK IMMEDIATE MEDICAL CARE IF:  You have pain that is getting worse and is not relieved by medications.  You develop chest pain that is associated with shortness or breath, sweating, feeling sick to your stomach (nauseous), or throw up (vomit).  Your pain becomes localized to the abdomen.  You develop any new symptoms that seem different or that concern you. MAKE SURE YOU:   Understand these instructions.  Will watch your condition.  Will get help right away if you are not doing well or get worse. Document Released: 06/28/2005 Document Revised: 09/20/2011 Document Reviewed: 03/02/2013 [facility] Patient Information 2015 [place], [place]. This information is not intended to replace advice given to you by your health care provider. Make sure you discuss any questions you have with your health care provider.  Heat Therapy Heat therapy can help make painful, stiff muscles and joints feel better. Do not use heat on new injuries. Wait at least 48 hours after an injury to use heat. Do not use heat when you have aches or pains right after an activity. If you still have pain 3 hours after stopping the activity, then you may use heat. HOME CARE Wet heat pack  Soak a clean towel in warm water. Squeeze out the extra water.  Put the warm, wet towel in  a plastic bag.  Place a thin, dry towel between your skin and the bag.  Put the heat pack on the area for 5 minutes, and check your skin. Your skin may be pink, but it should not be red.  Leave the heat pack on the area for 15 to 30 minutes.  Repeat this every 2 to 4 hours while awake. Do not use heat while you are sleeping. Warm water bath  Fill a tub with warm water.  Place the affected body part in the tub.  Soak the area for 20 to 40 minutes.  Repeat as needed. Hot water bottle  Fill  the water bottle half full with hot water.  Press out the extra air. Close the cap tightly.  Place a dry towel between your skin and the bottle.  Put the bottle on the area for 5 minutes, and check your skin. Your skin may be pink, but it should not be red.  Leave the bottle on the area for 15 to 30 minutes.  Repeat this every 2 to 4 hours while awake. Electric heating pad  Place a dry towel between your skin and the heating pad.  Set the heating pad on low heat.  Put the heating pad on the area for 10 minutes, and check your skin. Your skin may be pink, but it should not be red.  Leave the heating pad on the area for 20 to 40 minutes.  Repeat this every 2 to 4 hours while awake.  Do not lie on the heating pad.  Do not fall asleep while using the heating pad.  Do not use the heating pad near water. GET HELP RIGHT AWAY IF:  You get blisters or red skin.  Your skin is puffy (swollen), or you lose feeling (numbness) in the affected area.  You have any new problems.  Your problems are getting worse.  You have any questions or concerns. If you have any problems, stop using heat therapy until you see your doctor. MAKE SURE YOU:  Understand these instructions.  Will watch your condition.  Will get help right away if you are not doing well or get worse. Document Released: 09/20/2011 Document Reviewed: 08/21/2013 [facility] Patient Information 2015 [place]. This information is not intended to replace advice given to you by your health care provider. Make sure you discuss any questions you have with your health care provider.

## 2014-12-10 NOTE — ED Notes (Signed)
Pt here for follow up regarding pain to L arm. Pt fell injuring arm 2 weeks ago. Pt has rods already place. Pt has follow up with ortho but not for a couple more weeks.

## 2014-12-10 NOTE — ED Provider Notes (Signed)
CSN: [id number]     Arrival date & time 12/10/14  1825 History  This chart was scribed for non-physician practitioner, Allen DerryMercedes Camprubi-Soms, PA-C, working with Gilda Creasehristopher J Pollina, MD, by Ronney LionSuzanne Le, ED Scribe. This patient was seen in room TR08C/TR08C and the patient's care was started at 7:26 PM.      Chief Complaint  Patient presents with  . Arm Pain     Patient is a 39 y.o. male presenting with arm pain. The history is provided by the patient and medical records. No language interpreter was used.  Arm Pain This is a recurrent problem. The current episode started more than 1 week ago. The problem occurs constantly. The problem has been gradually worsening. Pertinent negatives include no chest pain, no abdominal pain, no headaches and no shortness of breath. Exacerbated by: touching and lifting. Nothing relieves the symptoms. Treatments tried: ibuprofen, Icyhot, and ice. The treatment provided no relief.     HPI Comments: Peter Glover is a 39 y.o. male with a history of left forearm fracture years ago requiring surgical rod placement, who presents to the Emergency Department complaining of constant, throbbing, 9/10, left forearm pain radiating to his thumb and third, fourth, fifth fingers, that began 2 weeks ago after falling and worsened over the past 2-3 days. He complains of associated tingling in his L 3-5th digits. Patient was evaluated for this about 10 days ago (see XR results below), discharged with Norco/Vicodin and Naprosyn, and was told to return if his symptoms worsen, which they have. He is not currently scheduled to see an orthopedist but has the number to the hand specialist. Touch and lifting all exacerbate the pain. Patient tried ibuprofen, IcyHot, and ice on it, all with no relief. He denies numbness, skin discoloration, warmth, swelling, chest pain, SOB, fever, abdominal pain, nausea, vomiting, shoulder pain, elbow pain, hematuria, or dysuria. He denies playing golf or tennis.     History reviewed. No pertinent past medical history. Past Surgical History  Procedure Laterality Date  . Orif forearm fracture Left approx 2008   No family history on file. History  Substance Use Topics  . Smoking status: Never Smoker   . Smokeless tobacco: Not on file  . Alcohol Use: Not on file    Review of Systems  Constitutional: Negative for fever and chills.  Respiratory: Negative for shortness of breath.   Cardiovascular: Negative for chest pain.  Gastrointestinal: Negative for nausea, vomiting and abdominal pain.  Genitourinary: Negative for dysuria and hematuria.  Musculoskeletal: Positive for myalgias (left forearm). Negative for joint swelling, arthralgias and neck pain.  Skin: Negative for color change.  Neurological: Negative for weakness, numbness and headaches.       +tingling in L 3-5th digits   10 Systems reviewed and all are negative for acute change except as noted in the HPI.   Allergies  Review of patient's allergies indicates no known allergies.  Home Medications   Prior to Admission medications   Medication Sig Start Date End Date Taking? Authorizing Provider  HYDROcodone-acetaminophen (NORCO/VICODIN) 5-325 MG per tablet Take 1-2 tablets every 6 hours as needed for severe pain 11/30/14   Renne CriglerJoshua Geiple, PA-C  naproxen (NAPROSYN) 500 MG tablet Take 1 tablet (500 mg total) by mouth 2 (two) times daily. 11/30/14   Renne CriglerJoshua Geiple, PA-C   BP 134/87 mmHg  Pulse 63  Temp(Src) 98.5 F (36.9 C) (Oral)  Resp 18  SpO2 98% Physical Exam  Constitutional: He is oriented to person, place, and time.  Vital signs are normal. He appears well-developed and well-nourished.  Non-toxic appearance. No distress.  Afebrile, nontoxic, NAD  HENT:  Head: Normocephalic and atraumatic.  Mouth/Throat: Mucous membranes are normal.  Eyes: Conjunctivae and EOM are normal. Right eye exhibits no discharge. Left eye exhibits no discharge.  Neck: Normal range of motion. Neck  supple. No spinous process tenderness and no muscular tenderness present. No rigidity. Normal range of motion present.  FROM intact without spinous process or paraspinous muscle TTP, no bony stepoffs or deformities, no muscle spasms. No rigidity or meningeal signs.  Cardiovascular: Normal rate and intact distal pulses.   Pulmonary/Chest: Effort normal. No respiratory distress.  Abdominal: Normal appearance. He exhibits no distension.  Musculoskeletal: Normal range of motion.       Left forearm: He exhibits tenderness. He exhibits no swelling, no edema and no deformity.       Arms: L forearm with mild TTP along radius, overlying the hardware from his prior fracture. No swelling or deformity, no erythema or warmth, no discoloration or skin changes. L elbow and wrist nonTTP without swelling or deformity, FROM intact. Sensation and strength grossly intact, distal pulses intact, soft compartments  Neurological: He is alert and oriented to person, place, and time. He has normal strength. No sensory deficit.  Skin: Skin is warm, dry and intact. No rash noted.  Psychiatric: He has a normal mood and affect.  Nursing note and vitals reviewed.   ED Course  Procedures (including critical care time)  DIAGNOSTIC STUDIES: Oxygen Saturation is 98% on RA, normal by my interpretation.    COORDINATION OF CARE: 7:37  PM - Discussed treatment plan with pt at bedside which includes pain medication and f/u with orthopedist, and pt agreed to plan.  Labs Review Labs Reviewed - No data to display  Imaging Review No results found.   Dg Forearm Left  11/30/2014   CLINICAL DATA:  Acute onset of left forearm pain and edema, status post fall. Initial encounter.  EXAM: LEFT FOREARM - 2 VIEW  COMPARISON:  Left forearm radiographs performed 06/04/2014  FINDINGS: There is no evidence of fracture or dislocation. Hardware along the midshaft of the radius and distal ulna appears intact, without evidence of loosening.  An  osseous fragment at the ulnar styloid reflects remote injury. The carpal rows appear grossly intact, and demonstrate normal alignment. The elbow joint is grossly unremarkable in appearance. No elbow joint effusion is seen.  IMPRESSION: No evidence of fracture or dislocation. Visualized hardware appears intact, without evidence of loosening.   Electronically Signed   By: Roanna Raider M.D.   On: 11/30/2014 21:01   Dg Wrist Complete Left  11/30/2014   CLINICAL DATA:  Left wrist pain, status post fall 2 days ago, history of surgery in 2008  EXAM: LEFT WRIST - COMPLETE 3+ VIEW  COMPARISON:  None.  FINDINGS: No evidence of acute fracture or dislocation.  Compression plate and screw fixation of the distal ulnar shaft, incompletely visualized.  Old ulnar styloid fracture.  The visualized soft tissues are unremarkable.  IMPRESSION: No evidence of acute fracture or dislocation.  Status post ORIF of the distal ulnar shaft, incompletely visualized.   Electronically Signed   By: Charline Bills M.D.   On: 11/30/2014 20:55    EKG Interpretation None      MDM   Final diagnoses:  Left forearm pain    39 y.o. male here with ongoing L forearm pain from a fall on 5/21. xrays then were negative. Neurovascularly  intact with soft compartments, doubt compartment syndrome. Discussed that ultimately he needs to see the hand specialist he was referred to, for possible eval to remove his hardware. Discussed use of naprosyn/percocet, and heat therapy for pain. Will have him call hand surgeon to whom he was referred the last time to see if he can get an appt in 1wk. Doubt need for further imaging or evaluation. I explained the diagnosis and have given explicit precautions to return to the ER including for any other new or worsening symptoms. The patient understands and accepts the medical plan as it's been dictated and I have answered their questions. Discharge instructions concerning home care and prescriptions have been  given. The patient is STABLE and is discharged to home in good condition.   I personally performed the services described in this documentation, which was scribed in my presence. The recorded information has been reviewed and is accurate.  BP 134/87 mmHg  Pulse 63  Temp(Src) 98.5 F (36.9 C) (Oral)  Resp 18  SpO2 98%  Meds ordered this encounter  Medications  . oxyCODONE-acetaminophen (PERCOCET/ROXICET) 5-325 MG per tablet 1 tablet    Sig:   . oxyCODONE-acetaminophen (PERCOCET) 5-325 MG per tablet    Sig: Take 1 tablet by mouth every 6 (six) hours as needed for severe pain.    Dispense:  20 tablet    Refill:  0    Order Specific Question:  Supervising Provider    Answer:  MILLER, BRIAN [[id number]]  . naproxen (NAPROSYN) 500 MG tablet    Sig: Take 1 tablet (500 mg total) by mouth 2 (two) times daily as needed for mild pain, moderate pain or headache (TAKE WITH MEALS.).    Dispense:  20 tablet    Refill:  0    Order Specific Question:  Supervising Provider    Answer:  Eber Hong [address] Tyhir Schwan Camprubi-Soms, PA-C 12/10/14 1951  Gilda Crease, MD 12/14/14 1021

## 2014-12-20 ENCOUNTER — Emergency Department (HOSPITAL_COMMUNITY)
Admission: EM | Admit: 2014-12-20 | Discharge: 2014-12-20 | Disposition: A | Discharge status: Home / Self Care | Payer: Medicaid Other | Attending: Emergency Medicine | Admitting: Emergency Medicine

## 2014-12-20 ENCOUNTER — Emergency Department (HOSPITAL_COMMUNITY): Payer: Medicaid Other

## 2014-12-20 ENCOUNTER — Encounter (HOSPITAL_COMMUNITY): Payer: Self-pay | Admitting: *Deleted

## 2014-12-20 DIAGNOSIS — M79632 Pain in left forearm: Secondary | ICD-10-CM | POA: Diagnosis not present

## 2014-12-20 DIAGNOSIS — M7989 Other specified soft tissue disorders: Secondary | ICD-10-CM | POA: Diagnosis present

## 2014-12-20 DIAGNOSIS — Z8781 Personal history of (healed) traumatic fracture: Secondary | ICD-10-CM | POA: Insufficient documentation

## 2014-12-20 DIAGNOSIS — R52 Pain, unspecified: Secondary | ICD-10-CM

## 2014-12-20 MED ORDER — OXYCODONE-ACETAMINOPHEN 5-325 MG PO TABS: 1.0000 | ORAL_TABLET | ORAL | Status: DC | PRN

## 2014-12-20 MED ORDER — KETOROLAC TROMETHAMINE 60 MG/2ML IM SOLN
60.0000 mg | Freq: Once | INTRAMUSCULAR | Status: AC
  Administered 2014-12-20: 60 mg via INTRAMUSCULAR
  Filled 2014-12-20: qty 2

## 2014-12-20 MED ORDER — MORPHINE SULFATE 4 MG/ML IJ SOLN
4.0000 mg | Freq: Once | INTRAMUSCULAR | Status: AC
  Administered 2014-12-20: 4 mg via INTRAMUSCULAR
  Filled 2014-12-20: qty 1

## 2014-12-20 NOTE — ED Provider Notes (Signed)
CSN: [id number]     Arrival date & time 12/20/14  2021 History   First MD Initiated Contact with Patient 12/20/14 2039     Chief Complaint  Patient presents with  . Arm Swelling     (Consider location/radiation/quality/duration/timing/severity/associated sxs/prior Treatment) HPI  39 year old male presents today complaining of left radial forearm pain that began several weeks ago after falling on his hand. He states that he has had plates put in his arm in the past. He feels like his arm could be broken. He was seen here twice before for this. The first visit revealed a negative x-Gurshan Settlemire. He was not x-rayed on the second visit. Patient states he has taken pain medicine and non-steroidal's without relief. Describes the pain as severe and throbbing.  History reviewed. No pertinent past medical history. Past Surgical History  Procedure Laterality Date  . Orif forearm fracture Left approx 2008   No family history on file. History  Substance Use Topics  . Smoking status: Never Smoker   . Smokeless tobacco: Current User  . Alcohol Use: No    Review of Systems  All other systems reviewed and are negative.     Allergies  Review of patient's allergies indicates no known allergies.  Home Medications   Prior to Admission medications   Medication Sig Start Date End Date Taking? Authorizing Provider  ibuprofen (ADVIL,MOTRIN) 800 MG tablet Take 800 mg by mouth every 8 (eight) hours as needed for moderate pain.   Yes Historical Provider, MD  naproxen (NAPROSYN) 500 MG tablet Take 1 tablet (500 mg total) by mouth 2 (two) times daily. Patient not taking: Reported on 12/10/2014 11/30/14   Renne Crigler, PA-C  naproxen (NAPROSYN) 500 MG tablet Take 1 tablet (500 mg total) by mouth 2 (two) times daily as needed for mild pain, moderate pain or headache (TAKE WITH MEALS.). 12/10/14   Mercedes Camprubi-Soms, PA-C   BP 136/84 mmHg  Pulse 87  Temp(Src) 97.8 F (36.6 C) (Oral)  Resp 18  Ht   (1.854 m)  Wt 220 lb (99.791 kg)  BMI 29.03 kg/m2  SpO2 98% Physical Exam  Constitutional: He appears well-developed.  Musculoskeletal: Normal range of motion. He exhibits tenderness. He exhibits no edema.       Arms: Radial pulse intact. Intrinsic hand muscles with normal strength. 2 point sensation intact. Full active range of motion of left wrist, elbow, and shoulder.radial side of forearm. There is no redness noted  Nursing note and vitals reviewed.   ED Course  Procedures (including critical care time) Labs Review Labs Reviewed - No data to display  Imaging Review Dg Forearm Left  12/20/2014   CLINICAL DATA:  Fall 1 month ago, worsening pain  EXAM: LEFT FOREARM - 2 VIEW  COMPARISON:  None.  FINDINGS: No evidence of acute fracture or dislocation.  Status post ORIF of the mid left radius and distal ulna. No evidence of hardware complication.  Old ulnar styloid fracture.  IMPRESSION: No evidence of acute fracture or dislocation.  Status post ORIF of the radius and ulna, as above.   Electronically Signed   By: Charline Bills M.D.   On: 12/20/2014 21:37     EKG Interpretation None      MDM   Final diagnoses:  Pain  Left forearm pain    39 year old male with left forearm pain. He is complaining of ongoing pain in the fall several weeks ago. He did not have a direct blow to this area. Repeat x-rays were  done and showed no evidence of fracture. He has point of her follow-up with orthopedist. Plan immobilization and advised using non-steroidal's and cold therapy. Reviewed the patient's controlled substance use and note 9 prescriptions over last year.  I have discussed with patient concern for continuing narcotics and potential for addiction.  Plan rx for 10.  Margarita Grizzle, MD 12/20/14 2220

## 2014-12-20 NOTE — ED Notes (Signed)
Pt. Left with all belongings and refused wheelchair 

## 2014-12-20 NOTE — Discharge Instructions (Signed)
Please use sling as needed for pain.  Use cold therapy and ice.  Please follow up with your orthopedist asap.

## 2014-12-20 NOTE — ED Notes (Signed)
Patients ring on the left ring finger removed with KY jelly and given to wife.

## 2014-12-20 NOTE — ED Notes (Signed)
Left arm swelling.  Has been seen for the same but the swelling is getting worse and unable to been seen by Ortho for 2 weeks

## 2014-12-20 NOTE — ED Notes (Signed)
Patient returned from X-ray 

## 2015-04-11 ENCOUNTER — Encounter (HOSPITAL_COMMUNITY): Payer: Self-pay | Admitting: *Deleted

## 2016-10-07 IMAGING — CR DG FOREARM 2V*L*
2 series · 2 of 2 positions shown · non-contrast
Comparison: Left forearm radiographs performed 06/04/2014

CLINICAL DATA: Acute onset of left forearm pain and edema, status
post fall. Initial encounter.

EXAM:
LEFT FOREARM - 2 VIEW

[forearm ap]
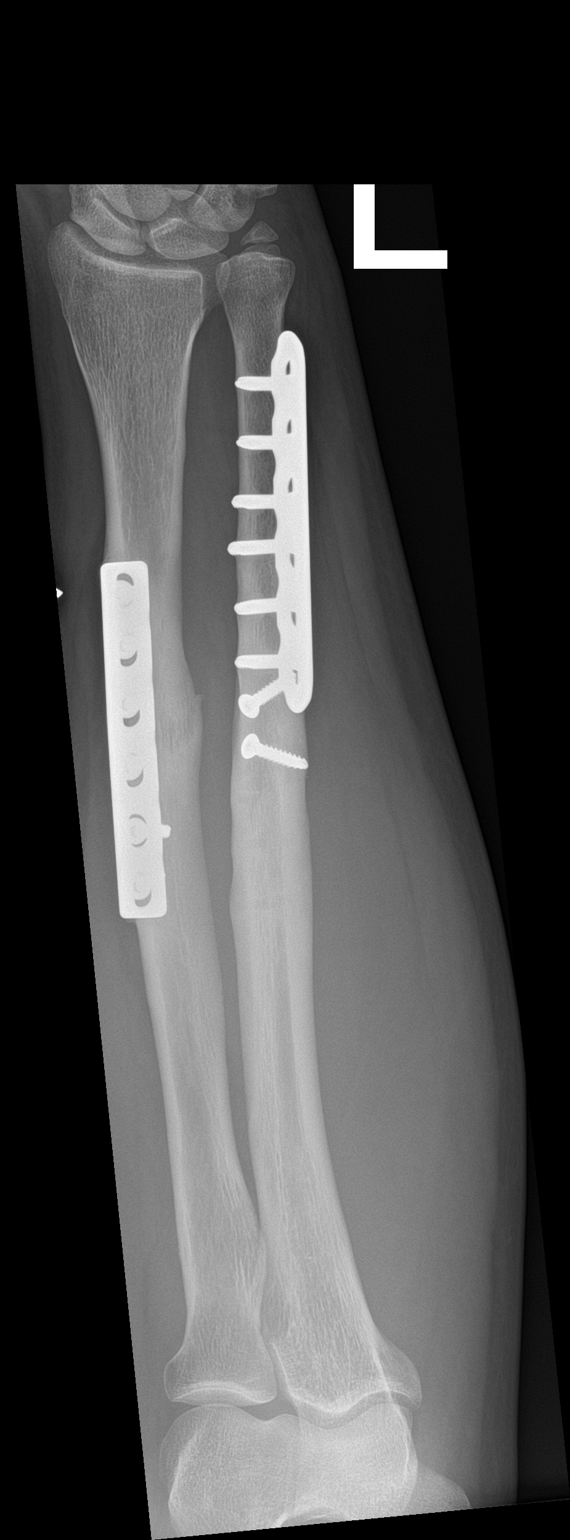

[forearm lat]
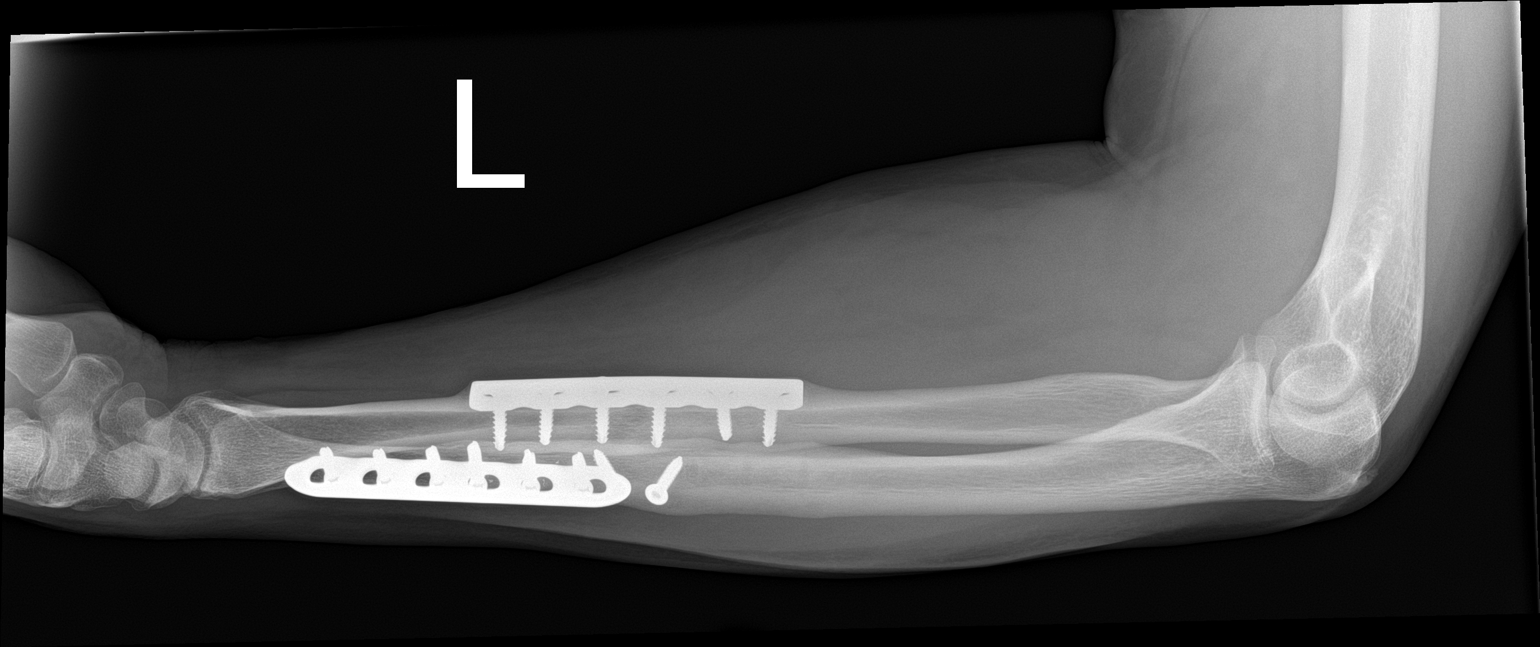

[2 of 2 positions shown; findings below may reference images not displayed]

FINDINGS: There is no evidence of fracture or dislocation. Hardware along the
midshaft of the radius and distal ulna appears intact, without
evidence of loosening.

An osseous fragment at the ulnar styloid reflects remote injury. The
carpal rows appear grossly intact, and demonstrate normal alignment.
The elbow joint is grossly unremarkable in appearance. No elbow
joint effusion is seen.
IMPRESSION: No evidence of fracture or dislocation. Visualized hardware appears
intact, without evidence of loosening.

## 2016-10-07 IMAGING — CR DG WRIST COMPLETE 3+V*L*
4 series · 4 of 4 positions shown · non-contrast
Comparison: None.

CLINICAL DATA: Left wrist pain, status post fall 2 days ago,
history of surgery in 9118

EXAM:
LEFT WRIST - COMPLETE 3+ VIEW

[wrist pa]
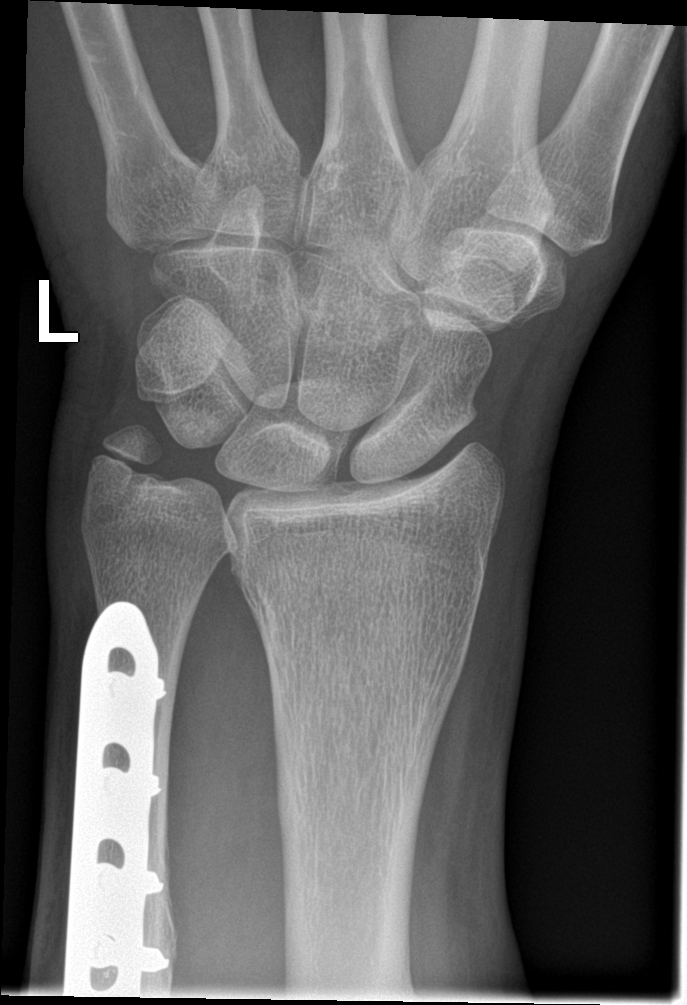

[wrist obl]
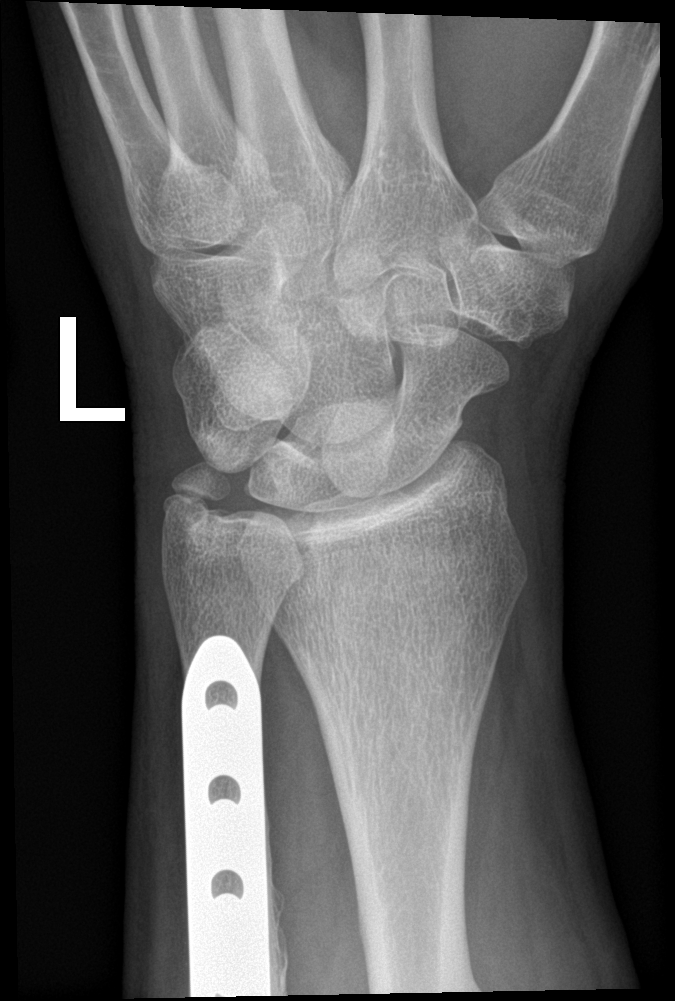

[wrist lat]
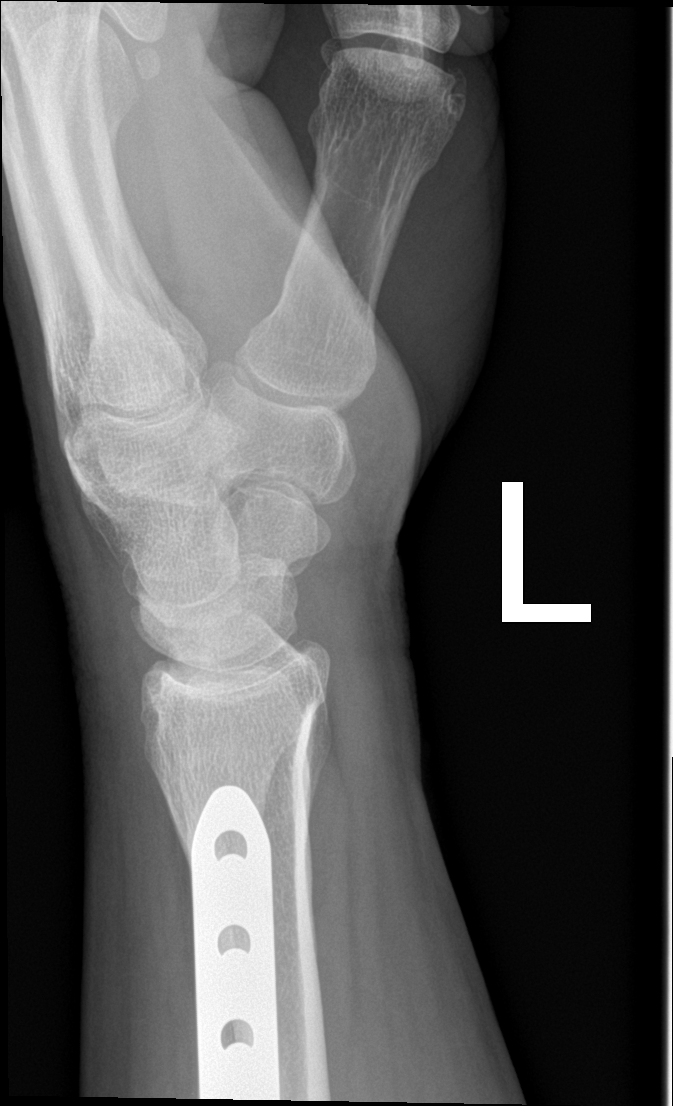

[wrist navicular]
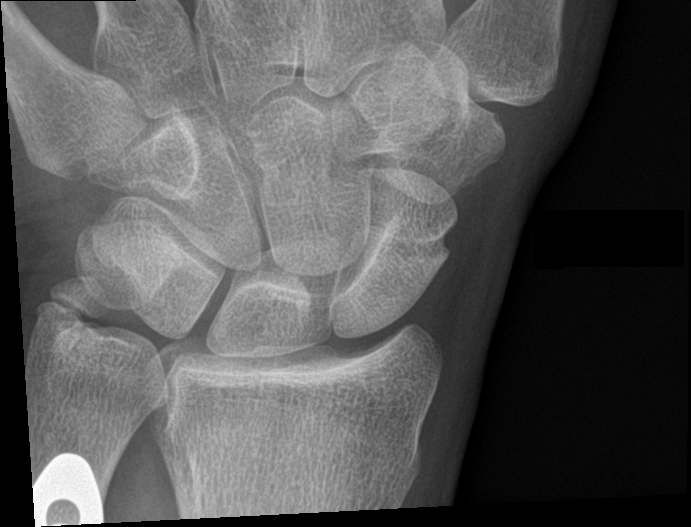

[4 of 4 positions shown; findings below may reference images not displayed]

FINDINGS: No evidence of acute fracture or dislocation.

Compression plate and screw fixation of the distal ulnar shaft,
incompletely visualized.

Old ulnar styloid fracture.

The visualized soft tissues are unremarkable.
IMPRESSION: No evidence of acute fracture or dislocation.

Status post ORIF of the distal ulnar shaft, incompletely visualized.

## 2017-04-03 ENCOUNTER — Emergency Department: Payer: Medicaid Other

## 2017-04-03 ENCOUNTER — Emergency Department
Admission: EM | Admit: 2017-04-03 | Discharge: 2017-04-03 | Disposition: A | Discharge status: Home / Self Care | Payer: Medicaid Other | Attending: Emergency Medicine | Admitting: Emergency Medicine

## 2017-04-03 ENCOUNTER — Encounter: Payer: Self-pay | Admitting: Emergency Medicine

## 2017-04-03 DIAGNOSIS — F17220 Nicotine dependence, chewing tobacco, uncomplicated: Secondary | ICD-10-CM | POA: Insufficient documentation

## 2017-04-03 DIAGNOSIS — Z79899 Other long term (current) drug therapy: Secondary | ICD-10-CM | POA: Insufficient documentation

## 2017-04-03 DIAGNOSIS — K0889 Other specified disorders of teeth and supporting structures: Secondary | ICD-10-CM | POA: Diagnosis present

## 2017-04-03 LAB — CBC
HCT: 37.3 % — ABNORMAL LOW (ref 40.0–52.0)
Hemoglobin: 12.7 g/dL — ABNORMAL LOW (ref 13.0–18.0)
MCH: 28.3 pg (ref 26.0–34.0)
MCHC: 34.1 g/dL (ref 32.0–36.0)
MCV: 82.9 fL (ref 80.0–100.0)
Platelets: 295 10*3/uL (ref 150–440)
RBC: 4.5 MIL/uL (ref 4.40–5.90)
RDW: 13.6 % (ref 11.5–14.5)
WBC: 8.9 10*3/uL (ref 3.8–10.6)

## 2017-04-03 LAB — COMPREHENSIVE METABOLIC PANEL
ALBUMIN: 4.2 g/dL (ref 3.5–5.0)
ALT: 69 U/L — AB (ref 17–63)
AST: 41 U/L (ref 15–41)
Alkaline Phosphatase: 64 U/L (ref 38–126)
Anion gap: 7 (ref 5–15)
BUN: 18 mg/dL (ref 6–20)
CHLORIDE: 103 mmol/L (ref 101–111)
CO2: 27 mmol/L (ref 22–32)
CREATININE: 0.82 mg/dL (ref 0.61–1.24)
Calcium: 8.6 mg/dL — ABNORMAL LOW (ref 8.9–10.3)
GFR calc Af Amer: >60 mL/min (ref >60)
GFR calc non Af Amer: >60 mL/min (ref >60)
GLUCOSE: 98 mg/dL (ref 65–99)
Potassium: 4.1 mmol/L (ref 3.5–5.1)
SODIUM: 137 mmol/L (ref 135–145)
Total Bilirubin: 0.9 mg/dL (ref 0.3–1.2)
Total Protein: 7.2 g/dL (ref 6.5–8.1)

## 2017-04-03 MED ORDER — LIDOCAINE VISCOUS 2 % MT SOLN: 10.0000 mL | OROMUCOSAL | 0 refills | Status: AC | PRN

## 2017-04-03 MED ORDER — OXYCODONE-ACETAMINOPHEN 5-325 MG PO TABS
1.0000 | ORAL_TABLET | Freq: Once | ORAL | Status: AC
  Administered 2017-04-03: 1 via ORAL
  Filled 2017-04-03: qty 1

## 2017-04-03 MED ORDER — IOPAMIDOL (ISOVUE-300) INJECTION 61%
75.0000 mL | Freq: Once | INTRAVENOUS | Status: AC | PRN
  Administered 2017-04-03: 75 mL via INTRAVENOUS
  Filled 2017-04-03: qty 75

## 2017-04-03 MED ORDER — AMOXICILLIN 500 MG PO CAPS: 500.0000 mg | ORAL_CAPSULE | Freq: Three times a day (TID) | ORAL | 0 refills | Status: AC

## 2017-04-03 MED ORDER — OXYCODONE-ACETAMINOPHEN 5-325 MG PO TABS: 1.0000 | ORAL_TABLET | Freq: Four times a day (QID) | ORAL | 0 refills | Status: AC | PRN

## 2017-04-03 NOTE — ED Triage Notes (Signed)
Pt states that he has been having left sided dental pain for several days. Pt had wisdom tooth surgery in April and has been having pain on and off since then.

## 2017-04-03 NOTE — ED Provider Notes (Signed)
[facility] Emergency Department Provider Note  ____________________________________________  Time seen: Approximately 6:48 PM  I have reviewed the triage vital signs and the nursing notes.   HISTORY  Chief Complaint Dental Pain    HPI Peter Glover is a 41 y.o. male that presents to the emergency department for evaluation of left lower dental pain on and off for 5 months. Patient got his wisdom teeth removed in April, and he has had pain on and off since then. Pain increased in severity the last 2 days. He is followed up with the oral surgeon four times since surgery and feels like nobody is helping him. He wants imaging of his jaw completed so he knows what is going on.No fever, swelling, drainage from mouth, shortness of breath, chest pain, nausea, vomiting.   History reviewed. No pertinent past medical history.  There are no active problems to display for this patient.   Past Surgical History:  Procedure Laterality Date  . FOREARM SURGERY Left   . ORIF FOREARM FRACTURE Left approx 2008    Prior to Admission medications   Medication Sig Start Date End Date Taking? Authorizing Provider  amoxicillin (AMOXIL) 500 MG capsule Take 1 capsule (500 mg total) by mouth 3 (three) times daily. 04/03/17   Enid Derry, PA-C  cetirizine-pseudoephedrine (ZYRTEC-D) 5-120 MG per tablet Take 1 tablet by mouth daily.    [provider]  cyclobenzaprine (FLEXERIL) 10 MG tablet Take 1 tablet (10 mg total) by mouth 3 (three) times daily as needed for muscle spasms. 10/30/14   Muthersbaugh, Dahlia Client, PA-C  ibuprofen (ADVIL,MOTRIN) 800 MG tablet Take 800 mg by mouth every 8 (eight) hours as needed for moderate pain.    [provider]  ibuprofen (ADVIL,MOTRIN) 800 MG tablet Take 1 tablet (800 mg total) by mouth 3 (three) times daily. 10/30/14   Muthersbaugh, Dahlia Client, PA-C  lidocaine (XYLOCAINE) 2 % solution Use as directed 10 mLs in the mouth or throat as  needed for mouth pain. 04/03/17   Enid Derry, PA-C  naproxen (NAPROSYN) 500 MG tablet Take 1 tablet (500 mg total) by mouth 2 (two) times daily. Patient not taking: Reported on 12/10/2014 11/30/14   Renne Crigler, PA-C  naproxen (NAPROSYN) 500 MG tablet Take 1 tablet (500 mg total) by mouth 2 (two) times daily as needed for mild pain, moderate pain or headache (TAKE WITH MEALS.). 12/10/14   Street, [place], PA-C  ondansetron (ZOFRAN ODT) 4 MG disintegrating tablet  ODT q4 hours prn nausea/vomit 10/30/14   Muthersbaugh, Dahlia Client, PA-C  oxyCODONE-acetaminophen (ROXICET) 5-325 MG tablet Take 1 tablet by mouth every 6 (six) hours as needed. 04/03/17 04/03/18  Enid Derry, PA-C  predniSONE (DELTASONE) 20 MG tablet 3 tabs po daily x 3 days, then 2 tabs x 3 days, then 1.5 tabs x 3 days, then 1 tab x 3 days, then 0.5 tabs x 3 days 10/30/14   Muthersbaugh, Dahlia Client, PA-C    Allergies Patient has no known allergies.  No family history on file.  Social History Social History  Substance Use Topics  . Smoking status: Never Smoker  . Smokeless tobacco: Current User    Types: Chew  . Alcohol use No     Review of Systems  Constitutional: No fever/chills Cardiovascular: No chest pain. Respiratory: No SOB. Gastrointestinal: No abdominal pain.  No nausea, no vomiting.  Musculoskeletal: Negative for musculoskeletal pain. Skin: Negative for rash, abrasions, lacerations, ecchymosis. Neurological: Negative for numbness or tingling   ____________________________________________   PHYSICAL EXAM:  VITAL SIGNS: ED Triage Vitals  Enc Vitals Group     BP 04/03/17 1534 134/80     Pulse Rate 04/03/17 1534 74     Resp 04/03/17 1534 16     Temp 04/03/17 1534 98.1 F (36.7 C)     Temp Source 04/03/17 1534 Oral     SpO2 04/03/17 1534 97 %     Weight --      Height --      Head Circumference --      Peak Flow --      Pain Score 04/03/17 1529 10     Pain Loc --      Pain Edu? --      Excl. in GC?  --      Constitutional: Alert and oriented. Well appearing and in no acute distress. Eyes: Conjunctivae are normal. PERRL. EOMI. Head: Atraumatic. ENT:      Ears:      Nose: No congestion/rhinnorhea.      Mouth/Throat: Mucous membranes are moist. Tenderness to palpation over left lower back wisdom tooth site. No visible swelling. No drainage in mouth. No TMJ pain. Neck: No stridor.  Cardiovascular: Normal rate, regular rhythm.  Good peripheral circulation. Respiratory: Normal respiratory effort without tachypnea or retractions. Lungs CTAB. Good air entry to the bases with no decreased or absent breath sounds. Musculoskeletal: Full range of motion to all extremities. No gross deformities appreciated. Neurologic:  Normal speech and language. No gross focal neurologic deficits are appreciated.  Skin:  Skin is warm, dry and intact. No rash noted.   ____________________________________________   LABS (all labs ordered are listed, but only abnormal results are displayed)  Labs Reviewed  CBC - Abnormal; Notable for the following:       Result Value   Hemoglobin 12.7 (*)    HCT 37.3 (*)    All other components within normal limits  COMPREHENSIVE METABOLIC PANEL - Abnormal; Notable for the following:    Calcium 8.6 (*)    ALT 69 (*)    All other components within normal limits   ____________________________________________  EKG   ____________________________________________  RADIOLOGY  Ct Maxillofacial W Contrast  Result Date: 04/03/2017 CLINICAL DATA:  Wisdom tooth surgery in May, constant pain for 1 week with swelling. EXAM: CT MAXILLOFACIAL WITH CONTRAST TECHNIQUE: Multidetector CT imaging of the maxillofacial structures was performed with intravenous contrast. Multiplanar CT image reconstructions were also generated. CONTRAST:  75mL ISOVUE-300 IOPAMIDOL (ISOVUE-300) INJECTION 61% COMPARISON:  CT HEAD April 30, 2011 FINDINGS: OSSEOUS: The mandible is intact, the condyles are  located. No acute facial fracture. No destructive bony lesions. Status post extracted tooth 32. Partially extracted tooth 17 with residual impacted bony fragment. ORBITS: Ocular globes and orbital contents are normal. SINUSES: Trace paranasal sinus mucosal thickening without air-fluid levels. Small RIGHT anterior ethmoid osteoma. Nasal septum is midline. Mastoid aircells are well aerated. SOFT TISSUES: No significant soft tissue swelling. No subcutaneous gas or radiopaque foreign bodies. Small reactive lymph nodes in the included neck. LIMITED INTRACRANIAL: Normal. 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 IMPRESSION: 1. Incompletely extracted tooth 17 (posterior mandible wisdom tooth) with impacted residual fragment. Status post extraction tooth 32. 2. No acute process in the face. Electronically Signed   By: Awilda Metro M.D.   On: 04/03/2017 18:17    ____________________________________________    PROCEDURES  Procedure(s) performed:    Procedures    Medications  iopamidol (ISOVUE-300) 61 % injection 75 mL (75 mLs Intravenous Contrast Given 04/03/17  1726)  oxyCODONE-acetaminophen (PERCOCET/ROXICET) 5-325 MG per tablet 1 tablet (1 tablet Oral Given 04/03/17 1847)     ____________________________________________   INITIAL IMPRESSION / ASSESSMENT AND PLAN / ED COURSE  Pertinent labs & imaging results that were available during my care of the patient were reviewed by me and considered in my medical decision making (see chart for details).  Review of the [place] CSRS was performed in accordance of the [facility] prior to dispensing any controlled drugs.   Patient presented to the emergency department for evaluation of left low jaw pain on and off for 5 months. Patient and wife requested CT scan. Vital signs and exam are reassuring. CT shows incompletely extracted tooth, which patient thinks he was told was left because it was too close to a nerve. Patient will be discharged home  with prescriptions for amoxicillin, a short course of Roxicet, viscous lidocaine. Patient is to follow up with oral surgeon as directed. Patient is given ED precautions to return to the ED for any worsening or new symptoms.     ____________________________________________  FINAL CLINICAL IMPRESSION(S) / ED DIAGNOSES  Final diagnoses:  Pain, dental      NEW MEDICATIONS STARTED DURING THIS VISIT:  New Prescriptions   AMOXICILLIN (AMOXIL) 500 MG CAPSULE    Take 1 capsule (500 mg total) by mouth 3 (three) times daily.   LIDOCAINE (XYLOCAINE) 2 % SOLUTION    Use as directed 10 mLs in the mouth or throat as needed for mouth pain.   OXYCODONE-ACETAMINOPHEN (ROXICET) 5-325 MG TABLET    Take 1 tablet by mouth every 6 (six) hours as needed.        This chart was dictated using voice recognition software/Dragon. Despite best efforts to proofread, errors can occur which can change the meaning. Any change was purely unintentional.    Enid Derry, PA-C 04/04/17 0747    Jeanmarie Plant, MD 04/04/17 2154

## 2017-04-03 NOTE — ED Notes (Signed)
Pt reports that he had surgery on his bottom wisdom teeth in May but over the last week the area has begun to hurt and it is a constant pain - c/o left lower wisdom tooth area is swollen and causing the pain - pt has been back to the oral surgeon several times with no relief

## 2017-04-03 NOTE — Discharge Instructions (Signed)
OPTIONS FOR DENTAL FOLLOW UP CARE ° °[place] [facility] and [facility] - Local Safety Net Dental Clinics °http://www.ncdhhs.gov/dph/oralhealth/services/safetynetclinics.htm °  °[facility] (336-562-3123) ° °[facility] [place] (919-933-9087) ° °[facility] (919-663-1744 ext [phone]) ° °[facility] ([phone]) ° °[facility] ([phone]) °This clinic caters to the indigent population and is on a lottery system. °Location: °[facility], [address], [place] °Clinic Hours: °Wednesdays from 6pm - 9pm, patients seen by a lottery system. °For dates, call or go to [email or web address]/[facility]/patients/[email or web address] °Services: °Cleanings, fillings and simple extractions. °Payment Options: °DENTAL WORK IS FREE OF CHARGE. Bring proof of income or support. °Best way to get seen: °Arrive at 5:15 pm - this is a lottery, NOT first come/first serve, so arriving earlier will not increase your chances of being seen. °  °  °[facility] °[phone] °Select option 1 for emergencies °  °Location: °[facility], [address], [place] °Clinic Hours: °No walk-ins accepted - call the day before to schedule an appointment. °Check in times are 9:30 am and 1:30 pm. °Services: °Simple extractions, temporary fillings, pulpectomy/pulp debridement, uncomplicated abscess drainage. °Payment Options: °PAYMENT IS DUE AT THE TIME OF SERVICE.  Fee is usually $100-200, additional surgical procedures (e.g. abscess drainage) may be extra. °Cash, checks, Visa/MasterCard accepted.  Can file Medicaid if patient is covered for dental - patient should call case worker to check. °No discount for [facility] patients. °Best way to get seen: °MUST call the day before and get onto the schedule. Can usually be seen the next 1-2 days. No walk-ins accepted. °  °  °[facility] °[phone] °   °Location: °[facility], [address], [place] °Clinic Hours: °M, W, Th, F 8am or 1:30pm, Tues 9a or 1:30 - first come/first served. °Services: °Simple extractions, temporary fillings, uncomplicated abscess drainage.  You do not need to be an [place] resident. °Payment Options: °PAYMENT IS DUE AT THE TIME OF SERVICE. °Dental insurance, otherwise sliding scale - bring proof of income or support. °Depending on income and treatment needed, cost is usually $50-200. °Best way to get seen: °Arrive early as it is first come/first served. °  °  °[facility] °[phone] °  °Location: °[address] °Clinic Hours: °Mon-Thu 8a-5p °Services: °Most basic dental services including extractions and fillings. °Payment Options: °PAYMENT IS DUE AT THE TIME OF SERVICE. °Sliding scale, up to 50% off - bring proof if income or support. °Medicaid with dental option accepted. °Best way to get seen: °Call to schedule an appointment, can usually be seen within 2 weeks OR they will try to see walk-ins - show up at 8a or 2p (you may have to wait). °  °  °[facility] °[phone] °[place] RESIDENTS ONLY °  °Location: °[facility], [address], [place], [place] [address] °Clinic Hours: By appointment only. °Monday - Thursday 8am-5pm, Friday 8am-12pm °Services: Cleanings, fillings, extractions. °Payment Options: °PAYMENT IS DUE AT THE TIME OF SERVICE. °Cash, Visa or MasterCard. Sliding scale - $30 minimum per service. °Best way to get seen: °Come in to office, complete packet and make an appointment - need proof of income °or support monies for each household member and proof of [place] residence. °Usually takes about a month to get in. °  °  °[facility] °[phone] °  °Location: °[address]   [place] °Clinic Hours: Walk-in Urgent Care Dental Services are offered Monday-Friday  mornings only. °The numbers of emergencies accepted daily is limited to the number of °providers available. °Maximum 15 - Mondays, Wednesdays & Thursdays °Maximum 10 - Tuesdays & Fridays °Services: °You do not need to be a [place] resident to be seen for a dental emergency. °Emergencies are defined as pain, swelling, abnormal bleeding, or dental trauma. Walkins will receive x-rays if needed. °NOTE: Dental cleaning is not an emergency. °Payment Options: °PAYMENT IS DUE AT THE TIME OF SERVICE. °Minimum co-pay is $40.00 for uninsured patients. °Minimum co-pay is $3.00 for Medicaid with dental coverage. °Dental Insurance is accepted and must be presented at time of visit. °Medicare does not cover dental. °Forms of payment: Cash, credit card, checks. °Best way to get seen: °If not previously registered with the clinic, walk-in dental registration begins at 7:15 am and is on a first come/first serve basis. °If previously registered with the clinic, call to make an appointment. °  °  °[facility] °[phone] °[place] RESIDENTS ONLY °  °Location: °[address], [place], [place] °Clinic Hours: °Mon-Thu 10a-2p °Services: Extractions only! °Payment Options: °FREE (donations accepted) - bring proof of income or support °Best way to get seen: °Call and schedule an appointment OR come at 8am on the 1st Monday of every month (except for holidays) when it is first come/first served. °  °  °[facility] °[phone] °  °Location: °[address], [place] °Clinic Hours: °Friday mornings °Services, Payment Options, Best way to get seen: °Call for info °

## 2021-11-24 ENCOUNTER — Encounter (HOSPITAL_BASED_OUTPATIENT_CLINIC_OR_DEPARTMENT_OTHER): Payer: Self-pay

## 2021-11-24 ENCOUNTER — Other Ambulatory Visit: Payer: Self-pay

## 2021-11-24 ENCOUNTER — Ambulatory Visit: Admission: EM | Admit: 2021-11-24 | Payer: Medicaid Other

## 2021-11-24 ENCOUNTER — Emergency Department (HOSPITAL_BASED_OUTPATIENT_CLINIC_OR_DEPARTMENT_OTHER)
Admission: EM | Admit: 2021-11-24 | Discharge: 2021-11-24 | Disposition: A | Discharge status: Home / Self Care | Payer: Medicaid Other | Attending: Emergency Medicine | Admitting: Emergency Medicine

## 2021-11-24 DIAGNOSIS — J3489 Other specified disorders of nose and nasal sinuses: Secondary | ICD-10-CM | POA: Diagnosis not present

## 2021-11-24 DIAGNOSIS — F17200 Nicotine dependence, unspecified, uncomplicated: Secondary | ICD-10-CM | POA: Insufficient documentation

## 2021-11-24 DIAGNOSIS — S058X2A Other injuries of left eye and orbit, initial encounter: Secondary | ICD-10-CM | POA: Diagnosis present

## 2021-11-24 DIAGNOSIS — X58XXXA Exposure to other specified factors, initial encounter: Secondary | ICD-10-CM | POA: Insufficient documentation

## 2021-11-24 DIAGNOSIS — S0502XA Injury of conjunctiva and corneal abrasion without foreign body, left eye, initial encounter: Secondary | ICD-10-CM | POA: Diagnosis not present

## 2021-11-24 MED ORDER — FLUORESCEIN SODIUM 1 MG OP STRP
1.0000 | ORAL_STRIP | Freq: Once | OPHTHALMIC | Status: AC
  Administered 2021-11-24: 1 via OPHTHALMIC
  Filled 2021-11-24: qty 1

## 2021-11-24 MED ORDER — ERYTHROMYCIN 5 MG/GM OP OINT
TOPICAL_OINTMENT | Freq: Once | OPHTHALMIC | Status: AC
Start: 2021-11-24 — End: 2021-11-24
  Administered 2021-11-24: 1 via OPHTHALMIC
  Filled 2021-11-24: qty 3.5

## 2021-11-24 MED ORDER — TETRACAINE HCL 0.5 % OP SOLN
2.0000 [drp] | Freq: Once | OPHTHALMIC | Status: AC
  Administered 2021-11-24: 2 [drp] via OPHTHALMIC
  Filled 2021-11-24: qty 4

## 2021-11-24 NOTE — ED Notes (Signed)
RN provided AVS using Teachback Method. Patient verbalizes understanding of Discharge Instructions. Opportunity for Questioning and Answers were provided by RN. Patient Discharged from ED ambulatory to Home with Family. ? ?

## 2021-11-24 NOTE — ED Notes (Signed)
PA at the Bedside. ?

## 2021-11-24 NOTE — Discharge Instructions (Addendum)
You were seen in the ER for evaluation of your left eye pain, it was discovered that your have an abrasion to your left eye. For this, we are sending you home with erythromycin ointment for you to use three times daily while awake. Additionally, you can use lubricating eye drops to help as well. Make sure to always wear eye protection when working outside. Please follow up with the ophthalmologist if you are not feeling better in the next 48 hours. I have included the information on the sheet for you to call.  ? ?Contact a doctor if: ?You keep having eye pain and other symptoms for more than 2 days. ?You get new symptoms, such as more redness, watery eyes, or discharge. ?You have discharge that makes your eyelids stick together in the morning. ?Your eye patch becomes so loose that you can blink your eye. ?Symptoms come back after your eye heals. ?Get help right away if: ?You have very bad eye pain that does not get better with medicine. ?You lose eyesight. ?

## 2021-11-24 NOTE — ED Provider Notes (Signed)
?[facility] EMERGENCY DEPT ?Provider Note ? ? ?CSN: [id number] ?Arrival date & time: 11/24/21  1210 ? ?  ? ?History ?Chief Complaint  ?Patient presents with  ? Conjunctivitis  ? ? ?Peter Glover is a 46 y.o. male otherwise healthy presents to the emergency department for evaluation of left eye pain for the past 4 days.  Patient reports that he was initially working in the yard which usually stirs up his allergies.  He reports he was rubbing his eyes a lot.  He reports that he had some pain initially but then improved over the last 2 days has been worsening.  He has noticed some clear watery discharge.  Denies any matting of the eye or any purulent discharge.  He reports its been itching and red.  He also has a runny nose and nasal congestion, but denies any sore throat.  No ear pain.  He has tried eye irrigation and eyedrops without much relief.  He denies any fever.  He has some mild photophobia and blurry vision.  Denies any contact use or any eyeglass wearing.  Denies any blunt trauma to the eye.  Denies any medical or surgical history.  No daily medications.  No known drug allergies.  Does dip tobacco.  Denies any EtOH or illicit drug use. ? ? ?Conjunctivitis ?Pertinent negatives include no chest pain, no headaches and no shortness of breath.  ? ?  ? ?Home Medications ?Prior to Admission medications   ?Medication Sig Start Date End Date Taking? Authorizing Provider  ?amoxicillin (AMOXIL) 500 MG capsule Take 1 capsule (500 mg total) by mouth 3 (three) times daily. 04/03/17   Enid Derry, PA-C  ?cetirizine-pseudoephedrine (ZYRTEC-D) 5-120 MG per tablet Take 1 tablet by mouth daily.    [provider]  ?cyclobenzaprine (FLEXERIL) 10 MG tablet Take 1 tablet (10 mg total) by mouth 3 (three) times daily as needed for muscle spasms. 10/30/14   Muthersbaugh, Dahlia Client, PA-C  ?ibuprofen (ADVIL,MOTRIN) 800 MG tablet Take 800 mg by mouth every 8 (eight) hours as needed for moderate pain.    [provider]  ?ibuprofen (ADVIL,MOTRIN) 800 MG tablet Take 1 tablet (800 mg total) by mouth 3 (three) times daily. 10/30/14   Muthersbaugh, Dahlia Client, PA-C  ?lidocaine (XYLOCAINE) 2 % solution Use as directed 10 mLs in the mouth or throat as needed for mouth pain. 04/03/17   Enid Derry, PA-C  ?naproxen (NAPROSYN) 500 MG tablet Take 1 tablet (500 mg total) by mouth 2 (two) times daily. ?Patient not taking: Reported on 12/10/2014 11/30/14   Renne Crigler, PA-C  ?naproxen (NAPROSYN) 500 MG tablet Take 1 tablet (500 mg total) by mouth 2 (two) times daily as needed for mild pain, moderate pain or headache (TAKE WITH MEALS.). 12/10/14   Street, [place], PA-C  ?ondansetron (ZOFRAN ODT) 4 MG disintegrating tablet 4mg  ODT q4 hours prn nausea/vomit 10/30/14   Muthersbaugh, 11/01/14, PA-C  ?predniSONE (DELTASONE) 20 MG tablet 3 tabs po daily x 3 days, then 2 tabs x 3 days, then 1.5 tabs x 3 days, then 1 tab x 3 days, then 0.5 tabs x 3 days 10/30/14   Muthersbaugh, 11/01/14, PA-C  ?   ? ?Allergies    ?Patient has no known allergies.   ? ?Review of Systems   ?Review of Systems  ?Constitutional:  Negative for chills and fever.  ?HENT:  Positive for congestion and rhinorrhea. Negative for sore throat.   ?Eyes:  Positive for photophobia, pain, discharge, redness, itching and visual disturbance.  ?  Respiratory:  Negative for shortness of breath.   ?Cardiovascular:  Negative for chest pain.  ?Neurological:  Negative for headaches.  ? ?Physical Exam ?Updated Vital Signs ?BP (!) 144/88 (BP Location: Right Arm)   Pulse 73   Temp 98.3 ?F (36.8 ?C)   Resp 18   Ht 6\' 1"  (1.854 m)   Wt 99.8 kg   SpO2 100%   BMI 29.03 kg/m?  ?Physical Exam ?Vitals and nursing note reviewed.  ?Constitutional:   ?   General: He is not in acute distress. ?   Appearance: Normal appearance. He is not toxic-appearing.  ?HENT:  ?   Head: Normocephalic and atraumatic.  ?   Nose: Rhinorrhea present.  ?   Mouth/Throat:  ?   Mouth: Mucous membranes are moist.  ?Eyes:   ?   General: Lids are normal. No scleral icterus. ?   Extraocular Movements: Extraocular movements intact.  ?   Right eye: Normal extraocular motion and no nystagmus.  ?   Left eye: Normal extraocular motion and no nystagmus.  ?   Pupils: Pupils are equal, round, and reactive to light.  ? ?   Comments: Erythema noted to the conjunctiva of the eye.  Pupils round and reactive and equal.  EOMI intact.  Corneal abrasion as marked above on the left eye. No dendritic lesions seen. No lid swelling noted.  No periorbital tenderness to palpation.  ?Pulmonary:  ?   Effort: Pulmonary effort is normal. No respiratory distress.  ?Musculoskeletal:  ?   Cervical back: Normal range of motion. No tenderness.  ?Lymphadenopathy:  ?   Cervical: No cervical adenopathy.  ?Skin: ?   General: Skin is dry.  ?   Findings: No rash.  ?Neurological:  ?   General: No focal deficit present.  ?   Mental Status: He is alert. Mental status is at baseline.  ?Psychiatric:     ?   Mood and Affect: Mood normal.  ? ? ?ED Results / Procedures / Treatments   ?Labs ?(all labs ordered are listed, but only abnormal results are displayed) ?Labs Reviewed - No data to display ? ?EKG ?None ? ?Radiology ?No results found. ? ?Procedures ?Procedures  ? ?Medications Ordered in ED ?Medications  ?fluorescein ophthalmic strip 1 strip (1 strip Left Eye Given 11/24/21 1246)  ?tetracaine (PONTOCAINE) 0.5 % ophthalmic solution 2 drop (2 drops Left Eye Given 11/24/21 1246)  ?erythromycin ophthalmic ointment (1 application. Left Eye Given 11/24/21 1353)  ? ? ?ED Course/ Medical Decision Making/ A&P ?  ?                        ?Medical Decision Making ?Risk ?Prescription drug management. ? ? ?46 year old male presents the emergency department for evaluation of left eye pain off and on for the past 4 days.  Differential diagnosis includes was not limited to corneal abrasion, ulceration, allergies, bacterial conjunctivitis, viral conjunctivitis, periorbital cellulitis.  Vital  signs show mildly elevated blood pressure 144/88, otherwise afebrile, normal pulse rate, satting well on room air without increased work of breathing.  Physical exam is pertinent for erythema noted to the conjunctiva of the eye.  Pupils round and reactive and equal.  EOMI intact.  Corneal abrasion as marked above on the left eye. No dendritic lesions seen. No lid swelling noted.  No periorbital tenderness to palpation.  ? ?Visual acuity is lowered at 20/100, but the patient reports it was because he was having pain upon opening his eye.  After the tetracaine drops, he is able to open his eye with a significant reduction in pain.  ? ?There is no dendritic lesion or ulceration noted.  His eye does appear dry and red. ? ?Given the corneal abrasion of his eye, will give him some of the erythromycin ointment in the emergency department and give him the tube to take home.  We talked about following up with ophthalmology if his pain does not improve in the next few days.  Also recommend using lubricating eyedrops as needed.  We discussed strict return precautions and red flag symptoms.  Wife and patient verbalized understanding and agreed to plan.  Patient is stable and being discharged home in good condition. ? ?Final Clinical Impression(s) / ED Diagnoses ?Final diagnoses:  ?Abrasion of left cornea, initial encounter  ? ? ?Rx / DC Orders ?ED Discharge Orders   ? ? None  ? ?  ? ? ?  ?Achille RichRansom, Jaylyne Breese, PA-C ?11/24/21 1857 ? ?  ?Margarita Grizzleay, Danielle, MD ?11/25/21 1025 ? ?

## 2021-11-24 NOTE — ED Triage Notes (Signed)
Pt states that approximately 3-4 days prior he was mowing his yard and thinks he may have gotten something in his L eye causing irritation. Pt states he has had blurry vision from that eye periodically, increased drainage.  ?
